# Patient Record
Sex: Male | Born: 1945
Health system: Southern US, Community
[De-identification: ages and names within clinical notes are randomized; demographics above are authoritative.]

## PROBLEM LIST (undated history)

## (undated) DIAGNOSIS — R7303 Prediabetes: Secondary | ICD-10-CM

## (undated) DIAGNOSIS — Z801 Family history of malignant neoplasm of trachea, bronchus and lung: Secondary | ICD-10-CM

## (undated) DIAGNOSIS — Z808 Family history of malignant neoplasm of other organs or systems: Secondary | ICD-10-CM

## (undated) DIAGNOSIS — Z8709 Personal history of other diseases of the respiratory system: Secondary | ICD-10-CM

## (undated) DIAGNOSIS — E785 Hyperlipidemia, unspecified: Secondary | ICD-10-CM

## (undated) DIAGNOSIS — E119 Type 2 diabetes mellitus without complications: Secondary | ICD-10-CM

## (undated) DIAGNOSIS — M2550 Pain in unspecified joint: Secondary | ICD-10-CM

## (undated) DIAGNOSIS — M1990 Unspecified osteoarthritis, unspecified site: Secondary | ICD-10-CM

## (undated) DIAGNOSIS — Z8042 Family history of malignant neoplasm of prostate: Secondary | ICD-10-CM

## (undated) DIAGNOSIS — M1611 Unilateral primary osteoarthritis, right hip: Secondary | ICD-10-CM

## (undated) DIAGNOSIS — I10 Essential (primary) hypertension: Secondary | ICD-10-CM

## (undated) DIAGNOSIS — Z803 Family history of malignant neoplasm of breast: Secondary | ICD-10-CM

## (undated) DIAGNOSIS — N529 Male erectile dysfunction, unspecified: Secondary | ICD-10-CM

## (undated) HISTORY — DX: Essential (primary) hypertension: I10

## (undated) HISTORY — DX: Family history of malignant neoplasm of trachea, bronchus and lung: Z80.1

## (undated) HISTORY — PX: CHOLECYSTECTOMY: SHX55

## (undated) HISTORY — PX: WRIST SURGERY: SHX841

## (undated) HISTORY — DX: Unspecified osteoarthritis, unspecified site: M19.90

## (undated) HISTORY — PX: FINGER SURGERY: SHX640

## (undated) HISTORY — PX: STEROID INJECTION TO SCAR: SHX2447

## (undated) HISTORY — PX: VEIN LIGATION AND STRIPPING: SHX2653

## (undated) HISTORY — DX: Male erectile dysfunction, unspecified: N52.9

## (undated) HISTORY — PX: SHOULDER SURGERY: SHX246

## (undated) HISTORY — PX: KNEE ARTHROSCOPY: SUR90

## (undated) HISTORY — DX: Family history of malignant neoplasm of prostate: Z80.42

## (undated) HISTORY — PX: TONSILLECTOMY: SUR1361

## (undated) HISTORY — DX: Hyperlipidemia, unspecified: E78.5

## (undated) HISTORY — DX: Family history of malignant neoplasm of other organs or systems: Z80.8

## (undated) HISTORY — PX: OTHER SURGICAL HISTORY: SHX169

## (undated) HISTORY — DX: Family history of malignant neoplasm of breast: Z80.3

## (undated) HISTORY — DX: Type 2 diabetes mellitus without complications: E11.9

## (undated) HISTORY — PX: COLONOSCOPY: SHX174

---

## 1970-07-01 HISTORY — PX: OTHER SURGICAL HISTORY: SHX169

## 2000-04-16 ENCOUNTER — Encounter: Admission: RE | Admit: 2000-04-16 | Discharge: 2000-07-15 | Payer: Self-pay | Admitting: Anesthesiology

## 2004-06-15 ENCOUNTER — Ambulatory Visit (HOSPITAL_COMMUNITY): Admission: RE | Admit: 2004-06-15 | Discharge: 2004-06-15 | Payer: Self-pay | Admitting: Gastroenterology

## 2004-09-26 ENCOUNTER — Encounter: Admission: RE | Admit: 2004-09-26 | Discharge: 2004-09-26 | Payer: Self-pay | Admitting: Family Medicine

## 2007-01-05 ENCOUNTER — Encounter: Admission: RE | Admit: 2007-01-05 | Discharge: 2007-01-05 | Payer: Self-pay | Admitting: Family Medicine

## 2007-02-27 ENCOUNTER — Ambulatory Visit (HOSPITAL_COMMUNITY): Admission: RE | Admit: 2007-02-27 | Discharge: 2007-02-27 | Payer: Self-pay | Admitting: Surgery

## 2010-11-13 NOTE — Op Note (Signed)
[name], [name]                 ACCOUNT NO.:  [id number]   MEDICAL RECORD NO.:  [id number]          PATIENT TYPE:  AMB   LOCATION:  DAY                          FACILITY:  [facility]   PHYSICIAN:  Ardeth Sportsman, MD     DATE OF BIRTH:  1945/09/11   DATE OF PROCEDURE:  02/27/2007  DATE OF DISCHARGE:                               OPERATIVE REPORT   PRIMARY CARE PHYSICIAN:  Donia Guiles, M.D.   SURGEON:  Ardeth Sportsman, MD.   ASSISTANT:  None.   PREOPERATIVE DIAGNOSIS:  Incarcerated ventral hernia from prior open  cholecystectomy incision. (Incarcerated with omentum)   POSTOPERATIVE DIAGNOSIS:  Incarcerated ventral hernia from prior open  cholecystectomy incision. (Incarcerated with omentum)   PROCEDURE:  Laparoscopic lysis of adhesions x60 minutes and a ventral  hernia repair using dual sided 20x30cm Proceed mesh (lightweight  polypropylene with nonadherent cellulose barrier).   ANESTHESIA:  1. General anesthesia.  2. Local anesthetic and field block around all fascial stitches and      port sites.   SPECIMENS:  None.   DRAINS:  None.   ASSISTANT:  Less than 5 mL   COMPLICATIONS:  None.   INDICATIONS FOR PROCEDURE:  [name] is a 65 year old male who had had  an open cholecystectomy done in 1985.  He had incisional hernia that was  repaired in 1987 but has developed recurrences.  Given his increased  physical activity, increasing discomfort at the incisional hernia site,  options discussed and recommendation was made for laparoscopic  exploration with repair of incisional hernia.   Risks such as stroke, heart attack, deep venous thrombosis, pulmonary  embolism and even death were discussed.  Risks such as bleeding, need  for transfusion, hematoma, seroma, wound infection, abscess, injury to  other organs, intracutaneous fistula, prolonged pain, recurrence of  hernia requiring reoperation and other risks were discussed.  Questions  answered and agreed to proceed.   OPERATIVE FINDINGS:  The medial half of his RUQ subcostal  cholecystectomy incision had Swiss cheese-like defects, several large  ones with a total region of 12 x 12 cm in size.  It was incarcerated  with omentum although transverse colon was nearby and probably was  sliding in and out as well.   DESCRIPTION OF PROCEDURE:  Informed consent was confirmed.  The patient  received IV antibiotics just prior to surgery.  He was positioned  supine, both arms tucked.  He had sequential compression devices active  during the entire case.  He underwent general anesthesia without any  difficulty.  He had a Foley catheter sterilely placed.  His abdomen was  prepped and draped in sterile fashion.   Entry was gained in the abdomen with the patient in steep reverse  Trendelenburg and left-side up and with a 5 mm port in left lower  quadrant using a 5 mm/0 degree scope and optical entry.  Capnoperitoneum  to 15 mmHg provided good abdominal insufflation.  Under direct  visualization a 10 mm port was placed in the left lower quadrant and a 5  mm port was placed in the  left lateral flank and a 5 mm port was placed  in the right lower quadrant.   Dense omental adhesions were noted on the anterior abdominal wall  especially around all of cholecystectomy incision.  These were freed off  sharply and hemostasis was  maintained using focused cautery.  There was  transverse colon nearby the area but able to be freed off and dissection  without cautery was used around those regions.  Ultimately, was able to  reduce the area out and measure out the defect as noted above.  Given  his large body habitus and obesity, I erred on the side of placing  larger piece of mesh, therefore a 20 x 30 cm mesh was chosen.  Alternating #1 Novofil and Ethibond stitches x10 were placed around the  rough edges of the mesh.  The mesh was rolled up, placed into the  peritoneal cavity, unrolled and tacked to the anterior abdominal  wall  using transfascial circumferential passes with Endoclose suture passer  device.  It provided over 2 inches of circumferential coverage around  the hernia defects to good result.  Because it was in right upper  quadrant and to avoid any tacking near the subcostal ridge, I ended up  passing a few extra transabdominal fascial stashes just through the mesh  after the mesh and tacked up to help secure the mesh on the superior,  especially right upper quadrant corners.  The mesh laid well.  Tacker  was used help tack the edges of the mesh well circumferentially.   Inspection was made of the omentum and other abdominal structures and  there was no evidence of any bleeding or any other injury.  Capnoperitoneum was completely evacuated.  Ports were removed.  The 10  mL port had been covered up by the mesh and I did not feel that it  required any more aggressive closure.  The other 5 mm ports were too  small to allow my pinky to pass.  Port sites were closed using 4-0  Monocryl stitch and puncture sites with fascial stitches were closed  using Dermabond.  A binder was placed.   The patient was extubated and taken to the recovery room in stable  condition.  I explained the operative findings to the patient's wife in  detail and also suggestion of postop instructions.  Questions answered.  She expressed understanding and appreciation.   The patient's wife had asked if I had removed any skin tags.  I was not  aware of this before surgery.  The patient did not mention it just  before surgery and it was not in my H&P and he did not add or change the  consent for that.  I apologized for not knowing about this, but will try  and address this issue and a later time since by the time she told me,  the patient was already in recovery.      Ardeth Sportsman, MD  Electronically Signed    SCG/MEDQ  D:  02/27/2007  T:  02/28/2007  Job:  [id number]   cc:   Donia Guiles, M.D.  Fax: [phone]

## 2010-11-16 NOTE — Procedures (Signed)
[facility]  Patient:    [name], [name]                        MRN: [id number] Proc. Date: 04/24/00 Adm. Date:  [id number] Attending:  Thyra Breed CC:         Jearld Adjutant, M.D.   Procedure Report  PROCEDURE:  Cervical epidural steroid injection.  DIAGNOSIS:  Cervical spondylosis with C6 radiculopathy, right upper extremity.  INTERVAL HISTORY:  The patient has noted marked attenuation in his discomfort. He is not taking any of the Vioxx now and really feels pretty good overall except for intermittent tingling out to the right thumb.  He feels very positive about where he is at.  He took his last dose of Vioxx on Sunday.  He is currently just taking a little bit of Advil as needed.  PHYSICAL EXAMINATION:  VITAL SIGNS:  The patient is afebrile with vital signs stable.  NEUROLOGIC:  Grossly unchanged.  DESCRIPTION OF PROCEDURE:  After informed consent was obtained, the patient was placed in the sitting position and monitored.  His back was prepped with Betadine x 3.  A skin wheal was raised at the C7-T1 interspace with 1% lidocaine.  A 20-gauge Tuohy needle was introduced to the cervical epidural space to loss of resistance to preservative-free normal saline.  There was no CSF nor blood.  Medrol 40 mg in 3 ml of preservative-free normal saline was gently injected.  The needle was flushed with preservative-free normal saline and removed intact.  POSTPROCEDURE CONDITION:  Stable.  DISCHARGE INSTRUCTIONS: 1. Resume previous diet. 2. Limitation of activities per instruction sheet. 3. Continue Advil p.r.n. 4. The patient plans to follow up with Dr. Renae Fickle. DD:  04/24/00 TD:  04/24/00 Job: [id number]
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
## 2011-04-12 LAB — HEMOGLOBIN AND HEMATOCRIT, BLOOD
HCT: 44.5
Hemoglobin: 15.5

## 2011-11-20 ENCOUNTER — Other Ambulatory Visit: Payer: Self-pay | Admitting: Orthopedic Surgery

## 2011-11-20 DIAGNOSIS — M25511 Pain in right shoulder: Secondary | ICD-10-CM

## 2011-11-21 ENCOUNTER — Ambulatory Visit
Admission: RE | Admit: 2011-11-21 | Discharge: 2011-11-21 | Disposition: A | Discharge status: Home / Self Care | Payer: 59 | Source: Ambulatory Visit | Attending: Orthopedic Surgery | Admitting: Orthopedic Surgery

## 2011-11-21 DIAGNOSIS — M25511 Pain in right shoulder: Secondary | ICD-10-CM

## 2011-12-06 DIAGNOSIS — Z87891 Personal history of nicotine dependence: Secondary | ICD-10-CM | POA: Diagnosis not present

## 2011-12-06 DIAGNOSIS — Z125 Encounter for screening for malignant neoplasm of prostate: Secondary | ICD-10-CM | POA: Diagnosis not present

## 2011-12-06 DIAGNOSIS — E782 Mixed hyperlipidemia: Secondary | ICD-10-CM | POA: Diagnosis not present

## 2011-12-06 DIAGNOSIS — Z0000 Encounter for general adult medical examination without abnormal findings: Secondary | ICD-10-CM | POA: Diagnosis not present

## 2011-12-06 DIAGNOSIS — I10 Essential (primary) hypertension: Secondary | ICD-10-CM | POA: Diagnosis not present

## 2011-12-06 DIAGNOSIS — Z01818 Encounter for other preprocedural examination: Secondary | ICD-10-CM | POA: Diagnosis not present

## 2011-12-06 DIAGNOSIS — E119 Type 2 diabetes mellitus without complications: Secondary | ICD-10-CM | POA: Diagnosis not present

## 2011-12-12 DIAGNOSIS — S43429A Sprain of unspecified rotator cuff capsule, initial encounter: Secondary | ICD-10-CM | POA: Diagnosis not present

## 2011-12-12 DIAGNOSIS — M67919 Unspecified disorder of synovium and tendon, unspecified shoulder: Secondary | ICD-10-CM | POA: Diagnosis not present

## 2011-12-12 DIAGNOSIS — G8918 Other acute postprocedural pain: Secondary | ICD-10-CM | POA: Diagnosis not present

## 2011-12-12 DIAGNOSIS — M719 Bursopathy, unspecified: Secondary | ICD-10-CM | POA: Diagnosis not present

## 2011-12-27 DIAGNOSIS — Z87891 Personal history of nicotine dependence: Secondary | ICD-10-CM | POA: Diagnosis not present

## 2012-01-29 DIAGNOSIS — M75120 Complete rotator cuff tear or rupture of unspecified shoulder, not specified as traumatic: Secondary | ICD-10-CM | POA: Diagnosis not present

## 2012-02-04 DIAGNOSIS — M75120 Complete rotator cuff tear or rupture of unspecified shoulder, not specified as traumatic: Secondary | ICD-10-CM | POA: Diagnosis not present

## 2012-02-07 DIAGNOSIS — M75120 Complete rotator cuff tear or rupture of unspecified shoulder, not specified as traumatic: Secondary | ICD-10-CM | POA: Diagnosis not present

## 2012-02-10 DIAGNOSIS — M75120 Complete rotator cuff tear or rupture of unspecified shoulder, not specified as traumatic: Secondary | ICD-10-CM | POA: Diagnosis not present

## 2012-02-14 DIAGNOSIS — M75120 Complete rotator cuff tear or rupture of unspecified shoulder, not specified as traumatic: Secondary | ICD-10-CM | POA: Diagnosis not present

## 2012-02-17 DIAGNOSIS — M75120 Complete rotator cuff tear or rupture of unspecified shoulder, not specified as traumatic: Secondary | ICD-10-CM | POA: Diagnosis not present

## 2012-02-21 DIAGNOSIS — M75120 Complete rotator cuff tear or rupture of unspecified shoulder, not specified as traumatic: Secondary | ICD-10-CM | POA: Diagnosis not present

## 2012-02-24 DIAGNOSIS — M75120 Complete rotator cuff tear or rupture of unspecified shoulder, not specified as traumatic: Secondary | ICD-10-CM | POA: Diagnosis not present

## 2012-02-25 DIAGNOSIS — J209 Acute bronchitis, unspecified: Secondary | ICD-10-CM | POA: Diagnosis not present

## 2012-02-25 DIAGNOSIS — H6690 Otitis media, unspecified, unspecified ear: Secondary | ICD-10-CM | POA: Diagnosis not present

## 2012-02-28 DIAGNOSIS — M75120 Complete rotator cuff tear or rupture of unspecified shoulder, not specified as traumatic: Secondary | ICD-10-CM | POA: Diagnosis not present

## 2012-03-04 DIAGNOSIS — M75120 Complete rotator cuff tear or rupture of unspecified shoulder, not specified as traumatic: Secondary | ICD-10-CM | POA: Diagnosis not present

## 2012-03-09 DIAGNOSIS — M75120 Complete rotator cuff tear or rupture of unspecified shoulder, not specified as traumatic: Secondary | ICD-10-CM | POA: Diagnosis not present

## 2012-03-13 DIAGNOSIS — M75120 Complete rotator cuff tear or rupture of unspecified shoulder, not specified as traumatic: Secondary | ICD-10-CM | POA: Diagnosis not present

## 2012-03-17 DIAGNOSIS — M75120 Complete rotator cuff tear or rupture of unspecified shoulder, not specified as traumatic: Secondary | ICD-10-CM | POA: Diagnosis not present

## 2012-03-20 DIAGNOSIS — M75120 Complete rotator cuff tear or rupture of unspecified shoulder, not specified as traumatic: Secondary | ICD-10-CM | POA: Diagnosis not present

## 2012-03-25 DIAGNOSIS — M75120 Complete rotator cuff tear or rupture of unspecified shoulder, not specified as traumatic: Secondary | ICD-10-CM | POA: Diagnosis not present

## 2012-04-08 DIAGNOSIS — M75120 Complete rotator cuff tear or rupture of unspecified shoulder, not specified as traumatic: Secondary | ICD-10-CM | POA: Diagnosis not present

## 2012-04-22 DIAGNOSIS — M75120 Complete rotator cuff tear or rupture of unspecified shoulder, not specified as traumatic: Secondary | ICD-10-CM | POA: Diagnosis not present

## 2012-04-29 DIAGNOSIS — M75120 Complete rotator cuff tear or rupture of unspecified shoulder, not specified as traumatic: Secondary | ICD-10-CM | POA: Diagnosis not present

## 2012-05-04 DIAGNOSIS — L821 Other seborrheic keratosis: Secondary | ICD-10-CM | POA: Diagnosis not present

## 2012-05-04 DIAGNOSIS — Z85828 Personal history of other malignant neoplasm of skin: Secondary | ICD-10-CM | POA: Diagnosis not present

## 2012-05-04 DIAGNOSIS — L909 Atrophic disorder of skin, unspecified: Secondary | ICD-10-CM | POA: Diagnosis not present

## 2012-05-04 DIAGNOSIS — I872 Venous insufficiency (chronic) (peripheral): Secondary | ICD-10-CM | POA: Diagnosis not present

## 2012-05-04 DIAGNOSIS — L919 Hypertrophic disorder of the skin, unspecified: Secondary | ICD-10-CM | POA: Diagnosis not present

## 2012-05-04 DIAGNOSIS — D239 Other benign neoplasm of skin, unspecified: Secondary | ICD-10-CM | POA: Diagnosis not present

## 2012-05-04 DIAGNOSIS — Z808 Family history of malignant neoplasm of other organs or systems: Secondary | ICD-10-CM | POA: Diagnosis not present

## 2012-05-06 DIAGNOSIS — M75120 Complete rotator cuff tear or rupture of unspecified shoulder, not specified as traumatic: Secondary | ICD-10-CM | POA: Diagnosis not present

## 2012-05-13 DIAGNOSIS — M75120 Complete rotator cuff tear or rupture of unspecified shoulder, not specified as traumatic: Secondary | ICD-10-CM | POA: Diagnosis not present

## 2012-05-20 DIAGNOSIS — M75120 Complete rotator cuff tear or rupture of unspecified shoulder, not specified as traumatic: Secondary | ICD-10-CM | POA: Diagnosis not present

## 2012-05-27 DIAGNOSIS — M75120 Complete rotator cuff tear or rupture of unspecified shoulder, not specified as traumatic: Secondary | ICD-10-CM | POA: Diagnosis not present

## 2012-06-12 DIAGNOSIS — I10 Essential (primary) hypertension: Secondary | ICD-10-CM | POA: Diagnosis not present

## 2012-06-12 DIAGNOSIS — E119 Type 2 diabetes mellitus without complications: Secondary | ICD-10-CM | POA: Diagnosis not present

## 2012-06-12 DIAGNOSIS — J069 Acute upper respiratory infection, unspecified: Secondary | ICD-10-CM | POA: Diagnosis not present

## 2012-06-12 DIAGNOSIS — E782 Mixed hyperlipidemia: Secondary | ICD-10-CM | POA: Diagnosis not present

## 2012-06-25 DIAGNOSIS — M75120 Complete rotator cuff tear or rupture of unspecified shoulder, not specified as traumatic: Secondary | ICD-10-CM | POA: Diagnosis not present

## 2012-06-29 DIAGNOSIS — M75120 Complete rotator cuff tear or rupture of unspecified shoulder, not specified as traumatic: Secondary | ICD-10-CM | POA: Diagnosis not present

## 2012-07-13 DIAGNOSIS — R7989 Other specified abnormal findings of blood chemistry: Secondary | ICD-10-CM | POA: Diagnosis not present

## 2012-10-07 DIAGNOSIS — M25559 Pain in unspecified hip: Secondary | ICD-10-CM | POA: Diagnosis not present

## 2012-10-07 DIAGNOSIS — M75120 Complete rotator cuff tear or rupture of unspecified shoulder, not specified as traumatic: Secondary | ICD-10-CM | POA: Diagnosis not present

## 2012-10-07 DIAGNOSIS — R109 Unspecified abdominal pain: Secondary | ICD-10-CM | POA: Diagnosis not present

## 2012-10-19 ENCOUNTER — Other Ambulatory Visit: Payer: Self-pay | Admitting: Family Medicine

## 2012-10-19 DIAGNOSIS — R1030 Lower abdominal pain, unspecified: Secondary | ICD-10-CM

## 2012-10-19 DIAGNOSIS — R1031 Right lower quadrant pain: Secondary | ICD-10-CM

## 2012-10-22 ENCOUNTER — Ambulatory Visit
Admission: RE | Admit: 2012-10-22 | Discharge: 2012-10-22 | Disposition: A | Discharge status: Home / Self Care | Payer: 59 | Source: Ambulatory Visit | Attending: Family Medicine | Admitting: Family Medicine

## 2012-10-22 DIAGNOSIS — R1030 Lower abdominal pain, unspecified: Secondary | ICD-10-CM

## 2012-10-22 DIAGNOSIS — R1031 Right lower quadrant pain: Secondary | ICD-10-CM

## 2012-10-22 DIAGNOSIS — K7689 Other specified diseases of liver: Secondary | ICD-10-CM | POA: Diagnosis not present

## 2012-10-22 MED ORDER — IOHEXOL 300 MG/ML  SOLN
150.0000 mL | Freq: Once | INTRAMUSCULAR | Status: AC | PRN
  Administered 2012-10-22: 150 mL via INTRAVENOUS

## 2012-11-03 DIAGNOSIS — M25559 Pain in unspecified hip: Secondary | ICD-10-CM | POA: Diagnosis not present

## 2012-11-05 DIAGNOSIS — M25559 Pain in unspecified hip: Secondary | ICD-10-CM | POA: Diagnosis not present

## 2012-11-11 DIAGNOSIS — M25559 Pain in unspecified hip: Secondary | ICD-10-CM | POA: Diagnosis not present

## 2012-11-13 DIAGNOSIS — M25559 Pain in unspecified hip: Secondary | ICD-10-CM | POA: Diagnosis not present

## 2012-11-27 DIAGNOSIS — M25559 Pain in unspecified hip: Secondary | ICD-10-CM | POA: Diagnosis not present

## 2012-12-01 DIAGNOSIS — M25559 Pain in unspecified hip: Secondary | ICD-10-CM | POA: Diagnosis not present

## 2012-12-03 DIAGNOSIS — M25559 Pain in unspecified hip: Secondary | ICD-10-CM | POA: Diagnosis not present

## 2012-12-07 DIAGNOSIS — M25559 Pain in unspecified hip: Secondary | ICD-10-CM | POA: Diagnosis not present

## 2012-12-17 DIAGNOSIS — M25559 Pain in unspecified hip: Secondary | ICD-10-CM | POA: Diagnosis not present

## 2012-12-24 DIAGNOSIS — M25559 Pain in unspecified hip: Secondary | ICD-10-CM | POA: Diagnosis not present

## 2012-12-30 DIAGNOSIS — M25559 Pain in unspecified hip: Secondary | ICD-10-CM | POA: Diagnosis not present

## 2013-01-06 DIAGNOSIS — M25559 Pain in unspecified hip: Secondary | ICD-10-CM | POA: Diagnosis not present

## 2013-01-11 DIAGNOSIS — S96819A Strain of other specified muscles and tendons at ankle and foot level, unspecified foot, initial encounter: Secondary | ICD-10-CM | POA: Diagnosis not present

## 2013-01-11 DIAGNOSIS — S93499A Sprain of other ligament of unspecified ankle, initial encounter: Secondary | ICD-10-CM | POA: Diagnosis not present

## 2013-01-14 DIAGNOSIS — M66369 Spontaneous rupture of flexor tendons, unspecified lower leg: Secondary | ICD-10-CM | POA: Diagnosis not present

## 2013-01-14 DIAGNOSIS — Y939 Activity, unspecified: Secondary | ICD-10-CM | POA: Diagnosis not present

## 2013-01-14 DIAGNOSIS — S93499A Sprain of other ligament of unspecified ankle, initial encounter: Secondary | ICD-10-CM | POA: Diagnosis not present

## 2013-01-14 DIAGNOSIS — M928 Other specified juvenile osteochondrosis: Secondary | ICD-10-CM | POA: Diagnosis not present

## 2013-01-14 DIAGNOSIS — Y999 Unspecified external cause status: Secondary | ICD-10-CM | POA: Diagnosis not present

## 2013-01-14 DIAGNOSIS — S96819A Strain of other specified muscles and tendons at ankle and foot level, unspecified foot, initial encounter: Secondary | ICD-10-CM | POA: Diagnosis not present

## 2013-01-14 DIAGNOSIS — Y929 Unspecified place or not applicable: Secondary | ICD-10-CM | POA: Diagnosis not present

## 2013-01-14 DIAGNOSIS — M7730 Calcaneal spur, unspecified foot: Secondary | ICD-10-CM | POA: Diagnosis not present

## 2013-01-14 DIAGNOSIS — X58XXXA Exposure to other specified factors, initial encounter: Secondary | ICD-10-CM | POA: Diagnosis not present

## 2013-03-18 DIAGNOSIS — M66369 Spontaneous rupture of flexor tendons, unspecified lower leg: Secondary | ICD-10-CM | POA: Diagnosis not present

## 2013-03-18 DIAGNOSIS — M7730 Calcaneal spur, unspecified foot: Secondary | ICD-10-CM | POA: Diagnosis not present

## 2013-03-24 DIAGNOSIS — M66369 Spontaneous rupture of flexor tendons, unspecified lower leg: Secondary | ICD-10-CM | POA: Diagnosis not present

## 2013-04-02 DIAGNOSIS — M25559 Pain in unspecified hip: Secondary | ICD-10-CM | POA: Diagnosis not present

## 2013-04-02 DIAGNOSIS — M7730 Calcaneal spur, unspecified foot: Secondary | ICD-10-CM | POA: Diagnosis not present

## 2013-04-05 DIAGNOSIS — M25559 Pain in unspecified hip: Secondary | ICD-10-CM | POA: Diagnosis not present

## 2013-04-05 DIAGNOSIS — M7730 Calcaneal spur, unspecified foot: Secondary | ICD-10-CM | POA: Diagnosis not present

## 2013-04-07 DIAGNOSIS — M7730 Calcaneal spur, unspecified foot: Secondary | ICD-10-CM | POA: Diagnosis not present

## 2013-04-07 DIAGNOSIS — M25559 Pain in unspecified hip: Secondary | ICD-10-CM | POA: Diagnosis not present

## 2013-04-13 DIAGNOSIS — M66369 Spontaneous rupture of flexor tendons, unspecified lower leg: Secondary | ICD-10-CM | POA: Diagnosis not present

## 2013-04-13 DIAGNOSIS — M25559 Pain in unspecified hip: Secondary | ICD-10-CM | POA: Diagnosis not present

## 2013-04-15 DIAGNOSIS — M25559 Pain in unspecified hip: Secondary | ICD-10-CM | POA: Diagnosis not present

## 2013-04-15 DIAGNOSIS — M66369 Spontaneous rupture of flexor tendons, unspecified lower leg: Secondary | ICD-10-CM | POA: Diagnosis not present

## 2013-04-20 DIAGNOSIS — M25559 Pain in unspecified hip: Secondary | ICD-10-CM | POA: Diagnosis not present

## 2013-04-22 DIAGNOSIS — M7730 Calcaneal spur, unspecified foot: Secondary | ICD-10-CM | POA: Diagnosis not present

## 2013-04-22 DIAGNOSIS — M25559 Pain in unspecified hip: Secondary | ICD-10-CM | POA: Diagnosis not present

## 2013-04-27 DIAGNOSIS — M25559 Pain in unspecified hip: Secondary | ICD-10-CM | POA: Diagnosis not present

## 2013-05-03 DIAGNOSIS — M25559 Pain in unspecified hip: Secondary | ICD-10-CM | POA: Diagnosis not present

## 2013-05-06 DIAGNOSIS — M66369 Spontaneous rupture of flexor tendons, unspecified lower leg: Secondary | ICD-10-CM | POA: Diagnosis not present

## 2013-05-06 DIAGNOSIS — M25559 Pain in unspecified hip: Secondary | ICD-10-CM | POA: Diagnosis not present

## 2013-05-10 DIAGNOSIS — M7730 Calcaneal spur, unspecified foot: Secondary | ICD-10-CM | POA: Diagnosis not present

## 2013-05-10 DIAGNOSIS — M25559 Pain in unspecified hip: Secondary | ICD-10-CM | POA: Diagnosis not present

## 2013-05-13 DIAGNOSIS — M7730 Calcaneal spur, unspecified foot: Secondary | ICD-10-CM | POA: Diagnosis not present

## 2013-05-13 DIAGNOSIS — M25559 Pain in unspecified hip: Secondary | ICD-10-CM | POA: Diagnosis not present

## 2013-05-19 DIAGNOSIS — IMO0002 Reserved for concepts with insufficient information to code with codable children: Secondary | ICD-10-CM | POA: Diagnosis not present

## 2013-05-20 DIAGNOSIS — M25559 Pain in unspecified hip: Secondary | ICD-10-CM | POA: Diagnosis not present

## 2013-05-20 DIAGNOSIS — M7730 Calcaneal spur, unspecified foot: Secondary | ICD-10-CM | POA: Diagnosis not present

## 2013-05-25 DIAGNOSIS — M25559 Pain in unspecified hip: Secondary | ICD-10-CM | POA: Diagnosis not present

## 2013-05-31 DIAGNOSIS — M25559 Pain in unspecified hip: Secondary | ICD-10-CM | POA: Diagnosis not present

## 2013-06-03 DIAGNOSIS — M66369 Spontaneous rupture of flexor tendons, unspecified lower leg: Secondary | ICD-10-CM | POA: Diagnosis not present

## 2013-06-03 DIAGNOSIS — M25559 Pain in unspecified hip: Secondary | ICD-10-CM | POA: Diagnosis not present

## 2013-06-08 DIAGNOSIS — M25559 Pain in unspecified hip: Secondary | ICD-10-CM | POA: Diagnosis not present

## 2013-06-11 DIAGNOSIS — M7730 Calcaneal spur, unspecified foot: Secondary | ICD-10-CM | POA: Diagnosis not present

## 2013-06-11 DIAGNOSIS — M25559 Pain in unspecified hip: Secondary | ICD-10-CM | POA: Diagnosis not present

## 2013-06-16 DIAGNOSIS — IMO0002 Reserved for concepts with insufficient information to code with codable children: Secondary | ICD-10-CM | POA: Diagnosis not present

## 2013-06-17 DIAGNOSIS — IMO0002 Reserved for concepts with insufficient information to code with codable children: Secondary | ICD-10-CM | POA: Diagnosis not present

## 2013-06-17 DIAGNOSIS — M66369 Spontaneous rupture of flexor tendons, unspecified lower leg: Secondary | ICD-10-CM | POA: Diagnosis not present

## 2013-06-17 DIAGNOSIS — M25559 Pain in unspecified hip: Secondary | ICD-10-CM | POA: Diagnosis not present

## 2013-06-18 ENCOUNTER — Other Ambulatory Visit: Payer: Self-pay | Admitting: Orthopedic Surgery

## 2013-06-18 DIAGNOSIS — M545 Low back pain: Secondary | ICD-10-CM

## 2013-06-22 DIAGNOSIS — M25559 Pain in unspecified hip: Secondary | ICD-10-CM | POA: Diagnosis not present

## 2013-06-25 ENCOUNTER — Ambulatory Visit
Admission: RE | Admit: 2013-06-25 | Discharge: 2013-06-25 | Disposition: A | Discharge status: Home / Self Care | Payer: Medicare Other | Source: Ambulatory Visit | Attending: Orthopedic Surgery | Admitting: Orthopedic Surgery

## 2013-06-25 DIAGNOSIS — M545 Low back pain: Secondary | ICD-10-CM

## 2013-06-25 DIAGNOSIS — M5126 Other intervertebral disc displacement, lumbar region: Secondary | ICD-10-CM | POA: Diagnosis not present

## 2013-06-25 DIAGNOSIS — M47817 Spondylosis without myelopathy or radiculopathy, lumbosacral region: Secondary | ICD-10-CM | POA: Diagnosis not present

## 2013-06-30 ENCOUNTER — Other Ambulatory Visit: Payer: 59

## 2013-06-30 DIAGNOSIS — M25559 Pain in unspecified hip: Secondary | ICD-10-CM | POA: Diagnosis not present

## 2013-07-02 DIAGNOSIS — IMO0002 Reserved for concepts with insufficient information to code with codable children: Secondary | ICD-10-CM | POA: Diagnosis not present

## 2013-07-06 DIAGNOSIS — M25559 Pain in unspecified hip: Secondary | ICD-10-CM | POA: Diagnosis not present

## 2013-07-13 DIAGNOSIS — IMO0002 Reserved for concepts with insufficient information to code with codable children: Secondary | ICD-10-CM | POA: Diagnosis not present

## 2013-07-21 DIAGNOSIS — M7730 Calcaneal spur, unspecified foot: Secondary | ICD-10-CM | POA: Diagnosis not present

## 2013-07-23 DIAGNOSIS — IMO0002 Reserved for concepts with insufficient information to code with codable children: Secondary | ICD-10-CM | POA: Diagnosis not present

## 2013-07-30 DIAGNOSIS — M545 Low back pain, unspecified: Secondary | ICD-10-CM | POA: Diagnosis not present

## 2013-08-02 DIAGNOSIS — IMO0002 Reserved for concepts with insufficient information to code with codable children: Secondary | ICD-10-CM | POA: Diagnosis not present

## 2013-08-06 DIAGNOSIS — IMO0002 Reserved for concepts with insufficient information to code with codable children: Secondary | ICD-10-CM | POA: Diagnosis not present

## 2013-08-09 DIAGNOSIS — M7730 Calcaneal spur, unspecified foot: Secondary | ICD-10-CM | POA: Diagnosis not present

## 2013-08-09 DIAGNOSIS — IMO0002 Reserved for concepts with insufficient information to code with codable children: Secondary | ICD-10-CM | POA: Diagnosis not present

## 2013-08-17 DIAGNOSIS — M545 Low back pain, unspecified: Secondary | ICD-10-CM | POA: Diagnosis not present

## 2013-08-20 DIAGNOSIS — M545 Low back pain, unspecified: Secondary | ICD-10-CM | POA: Diagnosis not present

## 2013-08-23 DIAGNOSIS — IMO0002 Reserved for concepts with insufficient information to code with codable children: Secondary | ICD-10-CM | POA: Diagnosis not present

## 2013-08-23 DIAGNOSIS — M545 Low back pain, unspecified: Secondary | ICD-10-CM | POA: Diagnosis not present

## 2013-08-24 DIAGNOSIS — IMO0002 Reserved for concepts with insufficient information to code with codable children: Secondary | ICD-10-CM | POA: Diagnosis not present

## 2013-08-30 DIAGNOSIS — M545 Low back pain, unspecified: Secondary | ICD-10-CM | POA: Diagnosis not present

## 2013-08-30 DIAGNOSIS — IMO0002 Reserved for concepts with insufficient information to code with codable children: Secondary | ICD-10-CM | POA: Diagnosis not present

## 2013-09-02 DIAGNOSIS — M545 Low back pain, unspecified: Secondary | ICD-10-CM | POA: Diagnosis not present

## 2013-09-06 DIAGNOSIS — M545 Low back pain, unspecified: Secondary | ICD-10-CM | POA: Diagnosis not present

## 2013-09-09 DIAGNOSIS — M545 Low back pain, unspecified: Secondary | ICD-10-CM | POA: Diagnosis not present

## 2013-09-13 DIAGNOSIS — M545 Low back pain, unspecified: Secondary | ICD-10-CM | POA: Diagnosis not present

## 2013-09-16 DIAGNOSIS — M545 Low back pain, unspecified: Secondary | ICD-10-CM | POA: Diagnosis not present

## 2013-09-20 DIAGNOSIS — M545 Low back pain, unspecified: Secondary | ICD-10-CM | POA: Diagnosis not present

## 2013-09-20 DIAGNOSIS — IMO0002 Reserved for concepts with insufficient information to code with codable children: Secondary | ICD-10-CM | POA: Diagnosis not present

## 2013-09-23 DIAGNOSIS — M545 Low back pain, unspecified: Secondary | ICD-10-CM | POA: Diagnosis not present

## 2013-09-30 DIAGNOSIS — M545 Low back pain, unspecified: Secondary | ICD-10-CM | POA: Diagnosis not present

## 2013-10-11 DIAGNOSIS — M545 Low back pain, unspecified: Secondary | ICD-10-CM | POA: Diagnosis not present

## 2013-11-10 DIAGNOSIS — L821 Other seborrheic keratosis: Secondary | ICD-10-CM | POA: Diagnosis not present

## 2013-11-10 DIAGNOSIS — Z85828 Personal history of other malignant neoplasm of skin: Secondary | ICD-10-CM | POA: Diagnosis not present

## 2013-11-10 DIAGNOSIS — D239 Other benign neoplasm of skin, unspecified: Secondary | ICD-10-CM | POA: Diagnosis not present

## 2013-11-10 DIAGNOSIS — Z808 Family history of malignant neoplasm of other organs or systems: Secondary | ICD-10-CM | POA: Diagnosis not present

## 2014-01-07 DIAGNOSIS — M25569 Pain in unspecified knee: Secondary | ICD-10-CM | POA: Diagnosis not present

## 2014-01-10 ENCOUNTER — Other Ambulatory Visit: Payer: Self-pay | Admitting: Family Medicine

## 2014-01-10 DIAGNOSIS — M25552 Pain in left hip: Secondary | ICD-10-CM

## 2014-01-10 DIAGNOSIS — R609 Edema, unspecified: Secondary | ICD-10-CM

## 2014-01-16 ENCOUNTER — Ambulatory Visit
Admission: RE | Admit: 2014-01-16 | Discharge: 2014-01-16 | Disposition: A | Discharge status: Home / Self Care | Payer: Medicare Other | Source: Ambulatory Visit | Attending: Family Medicine | Admitting: Family Medicine

## 2014-01-16 DIAGNOSIS — M169 Osteoarthritis of hip, unspecified: Secondary | ICD-10-CM | POA: Diagnosis not present

## 2014-01-16 DIAGNOSIS — M25552 Pain in left hip: Secondary | ICD-10-CM

## 2014-01-16 DIAGNOSIS — M1610 Unilateral primary osteoarthritis, unspecified hip: Secondary | ICD-10-CM | POA: Diagnosis not present

## 2014-01-16 DIAGNOSIS — R609 Edema, unspecified: Secondary | ICD-10-CM

## 2014-01-16 DIAGNOSIS — M25459 Effusion, unspecified hip: Secondary | ICD-10-CM | POA: Diagnosis not present

## 2014-01-24 DIAGNOSIS — M169 Osteoarthritis of hip, unspecified: Secondary | ICD-10-CM | POA: Diagnosis not present

## 2014-01-24 DIAGNOSIS — M1610 Unilateral primary osteoarthritis, unspecified hip: Secondary | ICD-10-CM | POA: Diagnosis not present

## 2014-01-24 DIAGNOSIS — M25559 Pain in unspecified hip: Secondary | ICD-10-CM | POA: Diagnosis not present

## 2014-01-25 ENCOUNTER — Other Ambulatory Visit: Payer: Self-pay | Admitting: Family Medicine

## 2014-01-25 DIAGNOSIS — M25552 Pain in left hip: Secondary | ICD-10-CM

## 2014-01-26 ENCOUNTER — Ambulatory Visit
Admission: RE | Admit: 2014-01-26 | Discharge: 2014-01-26 | Disposition: A | Discharge status: Home / Self Care | Payer: 59 | Source: Ambulatory Visit | Attending: Family Medicine | Admitting: Family Medicine

## 2014-01-26 DIAGNOSIS — M25552 Pain in left hip: Secondary | ICD-10-CM

## 2014-01-26 DIAGNOSIS — M25559 Pain in unspecified hip: Secondary | ICD-10-CM | POA: Diagnosis not present

## 2014-01-26 MED ORDER — METHYLPREDNISOLONE ACETATE 40 MG/ML INJ SUSP (RADIOLOG
120.0000 mg | Freq: Once | INTRAMUSCULAR | Status: AC
  Administered 2014-01-26: 120 mg via INTRA_ARTICULAR

## 2014-01-26 MED ORDER — IOHEXOL 180 MG/ML  SOLN
1.0000 mL | Freq: Once | INTRAMUSCULAR | Status: AC | PRN
  Administered 2014-01-26: 1 mL via INTRA_ARTICULAR

## 2014-09-13 DIAGNOSIS — I10 Essential (primary) hypertension: Secondary | ICD-10-CM | POA: Diagnosis not present

## 2014-09-13 DIAGNOSIS — E119 Type 2 diabetes mellitus without complications: Secondary | ICD-10-CM | POA: Diagnosis not present

## 2014-09-13 DIAGNOSIS — E782 Mixed hyperlipidemia: Secondary | ICD-10-CM | POA: Diagnosis not present

## 2014-09-21 DIAGNOSIS — M1612 Unilateral primary osteoarthritis, left hip: Secondary | ICD-10-CM | POA: Diagnosis not present

## 2014-09-24 DIAGNOSIS — M1612 Unilateral primary osteoarthritis, left hip: Secondary | ICD-10-CM | POA: Diagnosis not present

## 2014-10-13 ENCOUNTER — Other Ambulatory Visit: Payer: Self-pay | Admitting: Orthopaedic Surgery

## 2014-10-26 NOTE — Pre-Procedure Instructions (Signed)
HARDING THOMURE  10/26/2014   Your procedure is scheduled on:  Tues, May 10 @ 12:30 PM  Report to [facility] and report to Admitting at 10:30 AM.  Call this number if you have problems the morning of surgery: [phone]   Remember:   Do not eat food or drink liquids after midnight.                Stop taking your Aspirin,Diclofenac,Naproxen,Fish Oil,and Vit E. No Goody's,BC's,Ibuprofen,or any Herbal Medications.    Do not wear jewelry, make-up or nail polish.  Do not wear lotions, powders, or perfumes. You may wear deodorant.  Do not shave 48 hours prior to surgery. Men may shave face and neck.  Do not bring valuables to the hospital.  [facility] is not responsible                  for any belongings or valuables.               Contacts, dentures or bridgework may not be worn into surgery.  Leave suitcase in the car. After surgery it may be brought to your room.  For patients admitted to the hospital, discharge time is determined by your                treatment team.                   Special Instructions:  [place] - Preparing for Surgery  Before surgery, you can play an important role.  Because skin is not sterile, your skin needs to be as free of germs as possible.  You can reduce the number of germs on you skin by washing with CHG (chlorahexidine gluconate) soap before surgery.  CHG is an antiseptic cleaner which kills germs and bonds with the skin to continue killing germs even after washing.  Please DO NOT use if you have an allergy to CHG or antibacterial soaps.  If your skin becomes reddened/irritated stop using the CHG and inform your nurse when you arrive at Short Stay.  Do not shave (including legs and underarms) for at least 48 hours prior to the first CHG shower.  You may shave your face.  Please follow these instructions carefully:   1.  Shower with CHG Soap the night before surgery and the                                morning of Surgery.  2.  If you  choose to wash your hair, wash your hair first as usual with your       normal shampoo.  3.  After you shampoo, rinse your hair and body thoroughly to remove the                      Shampoo.  4.  Use CHG as you would any other liquid soap.  You can apply chg directly       to the skin and wash gently with scrungie or a clean washcloth.  5.  Apply the CHG Soap to your body ONLY FROM THE NECK DOWN.        Do not use on open wounds or open sores.  Avoid contact with your eyes,       ears, mouth and genitals (private parts).  Wash genitals (private parts)       with your  normal soap.  6.  Wash thoroughly, paying special attention to the area where your surgery        will be performed.  7.  Thoroughly rinse your body with warm water from the neck down.  8.  DO NOT shower/wash with your normal soap after using and rinsing off       the CHG Soap.  9.  Pat yourself dry with a clean towel.            10.  Wear clean pajamas.            11.  Place clean sheets on your bed the night of your first shower and do not        sleep with pets.  Day of Surgery  Do not apply any lotions/deoderants the morning of surgery.  Please wear clean clothes to the hospital/surgery center.     Please read over the following fact sheets that you were given: Pain Booklet, Coughing and Deep Breathing, Blood Transfusion Information, MRSA Information and Surgical Site Infection Prevention

## 2014-10-27 ENCOUNTER — Encounter (HOSPITAL_COMMUNITY): Payer: Self-pay

## 2014-10-27 ENCOUNTER — Encounter (HOSPITAL_COMMUNITY)
Admission: RE | Admit: 2014-10-27 | Discharge: 2014-10-27 | Disposition: A | Discharge status: Home / Self Care | Payer: 59 | Source: Ambulatory Visit | Attending: Orthopaedic Surgery | Admitting: Orthopaedic Surgery

## 2014-10-27 DIAGNOSIS — R918 Other nonspecific abnormal finding of lung field: Secondary | ICD-10-CM | POA: Diagnosis not present

## 2014-10-27 DIAGNOSIS — Z01818 Encounter for other preprocedural examination: Secondary | ICD-10-CM | POA: Diagnosis not present

## 2014-10-27 DIAGNOSIS — I10 Essential (primary) hypertension: Secondary | ICD-10-CM | POA: Insufficient documentation

## 2014-10-27 DIAGNOSIS — Z01812 Encounter for preprocedural laboratory examination: Secondary | ICD-10-CM | POA: Diagnosis not present

## 2014-10-27 DIAGNOSIS — Z01810 Encounter for preprocedural cardiovascular examination: Secondary | ICD-10-CM | POA: Diagnosis not present

## 2014-10-27 DIAGNOSIS — E785 Hyperlipidemia, unspecified: Secondary | ICD-10-CM | POA: Diagnosis not present

## 2014-10-27 DIAGNOSIS — I517 Cardiomegaly: Secondary | ICD-10-CM | POA: Diagnosis not present

## 2014-10-27 HISTORY — DX: Pain in unspecified joint: M25.50

## 2014-10-27 HISTORY — DX: Personal history of other diseases of the respiratory system: Z87.09

## 2014-10-27 LAB — BASIC METABOLIC PANEL
ANION GAP: 7 (ref 5–15)
BUN: 16 mg/dL (ref 6–23)
CO2: 28 mmol/L (ref 19–32)
CREATININE: 0.86 mg/dL (ref 0.50–1.35)
Calcium: 9.3 mg/dL (ref 8.4–10.5)
Chloride: 104 mmol/L (ref 96–112)
GFR calc non Af Amer: 88 mL/min — ABNORMAL LOW (ref >90)
Glucose, Bld: 159 mg/dL — ABNORMAL HIGH (ref 70–99)
Potassium: 4.1 mmol/L (ref 3.5–5.1)
SODIUM: 139 mmol/L (ref 135–145)

## 2014-10-27 LAB — SURGICAL PCR SCREEN
MRSA, PCR: NEGATIVE
Staphylococcus aureus: POSITIVE — AB

## 2014-10-27 LAB — PROTIME-INR
INR: 1.04 (ref 0.00–1.49)
Prothrombin Time: 13.8 seconds (ref 11.6–15.2)

## 2014-10-27 LAB — URINALYSIS, ROUTINE W REFLEX MICROSCOPIC
Bilirubin Urine: NEGATIVE
Glucose, UA: NEGATIVE mg/dL
Hgb urine dipstick: NEGATIVE
Ketones, ur: NEGATIVE mg/dL
NITRITE: NEGATIVE
Protein, ur: NEGATIVE mg/dL
SPECIFIC GRAVITY, URINE: 1.019 (ref 1.005–1.030)
UROBILINOGEN UA: 1 mg/dL (ref 0.0–1.0)
pH: 5.5 (ref 5.0–8.0)

## 2014-10-27 LAB — TYPE AND SCREEN
ABO/RH(D): O POS
Antibody Screen: NEGATIVE

## 2014-10-27 LAB — CBC WITH DIFFERENTIAL/PLATELET
Basophils Absolute: 0 10*3/uL (ref 0.0–0.1)
Basophils Relative: 0 % (ref 0–1)
Eosinophils Absolute: 0.2 10*3/uL (ref 0.0–0.7)
Eosinophils Relative: 3 % (ref 0–5)
HEMATOCRIT: 46.4 % (ref 39.0–52.0)
HEMOGLOBIN: 15.4 g/dL (ref 13.0–17.0)
Lymphocytes Relative: 20 % (ref 12–46)
Lymphs Abs: 1 10*3/uL (ref 0.7–4.0)
MCH: 30.4 pg (ref 26.0–34.0)
MCHC: 33.2 g/dL (ref 30.0–36.0)
MCV: 91.5 fL (ref 78.0–100.0)
MONO ABS: 0.5 10*3/uL (ref 0.1–1.0)
Monocytes Relative: 9 % (ref 3–12)
Neutro Abs: 3.5 10*3/uL (ref 1.7–7.7)
Neutrophils Relative %: 68 % (ref 43–77)
Platelets: 167 10*3/uL (ref 150–400)
RBC: 5.07 MIL/uL (ref 4.22–5.81)
RDW: 13.1 % (ref 11.5–15.5)
WBC: 5.2 10*3/uL (ref 4.0–10.5)

## 2014-10-27 LAB — URINE MICROSCOPIC-ADD ON

## 2014-10-27 LAB — ABO/RH: ABO/RH(D): O POS

## 2014-10-27 LAB — APTT: aPTT: 30 seconds (ref 24–37)

## 2014-10-27 MED ORDER — CHLORHEXIDINE GLUCONATE 4 % EX LIQD: 60.0000 mL | Freq: Once | CUTANEOUS | Status: DC

## 2014-10-27 NOTE — Progress Notes (Signed)
Anesthesia Chart Review:  Pt is 69 year old male scheduled for L total hip arthroplasty on 11/08/2014 with Dr. Rhona Raider.   PMH includes: HTN, hyperlipidemia. Former smoker. BMI 42.   Preoperative labs reviewed.  Few bacteria and 7-10 WBC/hpf on urine microscopy.   Chest x-ray reviewed. L basilar nodular density. CT chest recommended.   EKG: NSR. Cannot rule out Anterior infarct, age undetermined  Notified Juliann Pulse in Dr. Jerald Kief office of urine and CXR results.   Discussed EKG with Dr. Lissa Hoard.   If no changes, I anticipate pt can proceed with surgery as scheduled.   Willeen Cass, FNP-BC [facility] Short Stay Surgical Center/Anesthesiology Phone: [phone] 10/27/2014 4:11 PM

## 2014-10-27 NOTE — Progress Notes (Signed)
Mupirocin script called into the [facility] on [address] and [address]

## 2014-10-27 NOTE — Progress Notes (Addendum)
Pt saw a Cardiologist in the 80's-saw just bc of family history   Medical Md is Dr.Kimberlee Brigitte Pulse  Denies ever having an Echo  Stress test in the 80's  Denies ever having a Heart cath  Denies having an EKG/CXR

## 2014-10-27 NOTE — Progress Notes (Signed)
   10/27/14 0946  OBSTRUCTIVE SLEEP APNEA  Have you ever been diagnosed with sleep apnea through a sleep study? No  Do you snore loudly (loud enough to be heard through closed doors)?  1  Do you often feel tired, fatigued, or sleepy during the daytime? 0  Has anyone observed you stop breathing during your sleep? 0  Do you have, or are you being treated for high blood pressure? 1  BMI more than 35 kg/m2? 1  Age over 68 years old? 1  Neck circumference greater than 40 cm/16 inches? 1 (19)  Gender: 1

## 2014-11-04 NOTE — H&P (Signed)
TOTAL HIP ADMISSION H&P  Patient is admitted for left total hip arthroplasty.  Subjective:  Chief Complaint: left hip pain  HPI: [name], 69 y.o. male, has a history of pain and functional disability in the left hip(s) due to arthritis and patient has failed non-surgical conservative treatments for greater than 12 weeks to include NSAID's and/or analgesics, corticosteriod injections, flexibility and strengthening excercises, weight reduction as appropriate and activity modification.  Onset of symptoms was gradual starting 5 years ago with gradually worsening course since that time.The patient noted no past surgery on the left hip(s).  Patient currently rates pain in the left hip at 10 out of 10 with activity. Patient has night pain, worsening of pain with activity and weight bearing, trendelenberg gait, pain that interfers with activities of daily living and crepitus. Patient has evidence of subchondral cysts, subchondral sclerosis, periarticular osteophytes and joint space narrowing by imaging studies. This condition presents safety issues increasing the risk of falls. There is no current active infection.  There are no active problems to display for this patient.  Past Medical History  Diagnosis Date  . Hyperlipidemia     takes Simvastatin daily  . Osteoarthritis     knee  . ED (erectile dysfunction)     takes Viagra as needed  . Hypertension     takes Lisinopril daily  . Joint pain   . History of bronchitis last time about 38yrs ago    Past Surgical History  Procedure Laterality Date  . Achiles tendon Left   . Shoulder surgery Right   . Knee arthroscopy Left   . Finger surgery      left pointer   . Cholecystectomy      with mesh  . Vein ligation and stripping Left at age 54  . Wrist surgery Left at age 24  . Wisdom teeth extracted  1972  . Colonoscopy    . Steroid injection to scar      about 6 months ago    No prescriptions prior to admission   No Known Allergies   History  Substance Use Topics  . Smoking status: Former Research scientist (life sciences)  . Smokeless tobacco: Not on file     Comment: quit smoking in 1993  . Alcohol Use: Yes     Comment: rarely    No family history on file.   Review of Systems  Musculoskeletal: Positive for joint pain.       Left hip  All other systems reviewed and are negative.   Objective:  Physical Exam  Constitutional: He is oriented to person, place, and time. He appears well-developed and well-nourished.  HENT:  Head: Normocephalic and atraumatic.  Eyes: Conjunctivae are normal. Pupils are equal, round, and reactive to light.  Neck: Normal range of motion.  Cardiovascular: Normal rate and regular rhythm.   Respiratory: Effort normal.  GI: Soft.  Musculoskeletal:  Left hip motion is a bit limited in rotation and somewhat painful.  Leg lengths look roughly equal.  He walks with a markedly altered gait.  He is significantly overweight but most of this is central.  His skin is benign across the hip.  Sensation and motor function are intact in his feet with palpable pulses on both sides.  There is no lymphadenopathy at the groin.  Neurological: He is alert and oriented to person, place, and time.  Skin: Skin is warm.  Psychiatric: He has a normal mood and affect. His behavior is normal. Judgment and thought content normal.  Vital signs in last 24 hours:    Labs:   There is no height or weight on file to calculate BMI.   Imaging Review Plain radiographs demonstrate severe degenerative joint disease of the left hip(s). The bone quality appears to be good for age and reported activity level.  Assessment/Plan:  End stage primary arthritis, left hip(s)  The patient history, physical examination, clinical judgement of the provider and imaging studies are consistent with end stage degenerative joint disease of the left hip(s) and total hip arthroplasty is deemed medically necessary. The treatment options including medical  management, injection therapy, arthroscopy and arthroplasty were discussed at length. The risks and benefits of total hip arthroplasty were presented and reviewed. The risks due to aseptic loosening, infection, stiffness, dislocation/subluxation,  thromboembolic complications and other imponderables were discussed.  The patient acknowledged the explanation, agreed to proceed with the plan and consent was signed. Patient is being admitted for inpatient treatment for surgery, pain control, PT, OT, prophylactic antibiotics, VTE prophylaxis, progressive ambulation and ADL's and discharge planning.The patient is planning to be discharged home with home health services

## 2014-11-07 MED ORDER — CEFAZOLIN SODIUM 10 G IJ SOLR
3.0000 g | INTRAMUSCULAR | Status: AC
  Administered 2014-11-08: 3 g via INTRAVENOUS
  Filled 2014-11-07: qty 3000

## 2014-11-07 NOTE — Progress Notes (Signed)
Message left for patient to come in at 915 for surgery tomorrow.ssc number [phone] left for any questions

## 2014-11-08 ENCOUNTER — Inpatient Hospital Stay (HOSPITAL_COMMUNITY)
Admission: RE | Admit: 2014-11-08 | Discharge: 2014-11-11 | DRG: 470 | Disposition: A | Discharge status: Home Health | Payer: 59 | Source: Ambulatory Visit | Attending: Orthopaedic Surgery | Admitting: Orthopaedic Surgery

## 2014-11-08 ENCOUNTER — Encounter (HOSPITAL_COMMUNITY)
Admission: RE | Disposition: A | Discharge status: Home Health | Payer: Self-pay | Source: Ambulatory Visit | Attending: Orthopaedic Surgery

## 2014-11-08 ENCOUNTER — Inpatient Hospital Stay (HOSPITAL_COMMUNITY): Payer: 59 | Admitting: Emergency Medicine

## 2014-11-08 ENCOUNTER — Inpatient Hospital Stay (HOSPITAL_COMMUNITY): Payer: 59

## 2014-11-08 ENCOUNTER — Inpatient Hospital Stay (HOSPITAL_COMMUNITY): Payer: 59 | Admitting: Certified Registered Nurse Anesthetist

## 2014-11-08 ENCOUNTER — Encounter (HOSPITAL_COMMUNITY): Payer: Self-pay | Admitting: Certified Registered Nurse Anesthetist

## 2014-11-08 DIAGNOSIS — Z7982 Long term (current) use of aspirin: Secondary | ICD-10-CM

## 2014-11-08 DIAGNOSIS — Z87891 Personal history of nicotine dependence: Secondary | ICD-10-CM

## 2014-11-08 DIAGNOSIS — R9389 Abnormal findings on diagnostic imaging of other specified body structures: Secondary | ICD-10-CM

## 2014-11-08 DIAGNOSIS — Z79899 Other long term (current) drug therapy: Secondary | ICD-10-CM

## 2014-11-08 DIAGNOSIS — I10 Essential (primary) hypertension: Secondary | ICD-10-CM | POA: Diagnosis present

## 2014-11-08 DIAGNOSIS — Z96642 Presence of left artificial hip joint: Secondary | ICD-10-CM | POA: Diagnosis not present

## 2014-11-08 DIAGNOSIS — Z419 Encounter for procedure for purposes other than remedying health state, unspecified: Secondary | ICD-10-CM

## 2014-11-08 DIAGNOSIS — E785 Hyperlipidemia, unspecified: Secondary | ICD-10-CM | POA: Diagnosis present

## 2014-11-08 DIAGNOSIS — M1612 Unilateral primary osteoarthritis, left hip: Secondary | ICD-10-CM | POA: Diagnosis not present

## 2014-11-08 DIAGNOSIS — N529 Male erectile dysfunction, unspecified: Secondary | ICD-10-CM | POA: Diagnosis present

## 2014-11-08 DIAGNOSIS — M25552 Pain in left hip: Secondary | ICD-10-CM | POA: Diagnosis not present

## 2014-11-08 DIAGNOSIS — M169 Osteoarthritis of hip, unspecified: Secondary | ICD-10-CM | POA: Diagnosis not present

## 2014-11-08 DIAGNOSIS — Z471 Aftercare following joint replacement surgery: Secondary | ICD-10-CM | POA: Diagnosis not present

## 2014-11-08 HISTORY — PX: TOTAL HIP ARTHROPLASTY: SHX124

## 2014-11-08 SURGERY — ARTHROPLASTY, HIP, TOTAL, ANTERIOR APPROACH
Anesthesia: Spinal | Site: Hip | Laterality: Left

## 2014-11-08 MED ORDER — METOCLOPRAMIDE HCL 5 MG/ML IJ SOLN: 5.0000 mg | Freq: Three times a day (TID) | INTRAMUSCULAR | Status: DC | PRN

## 2014-11-08 MED ORDER — PROPOFOL INFUSION 10 MG/ML OPTIME
INTRAVENOUS | Status: DC | PRN
  Administered 2014-11-08: 100 ug/kg/min via INTRAVENOUS

## 2014-11-08 MED ORDER — PHENYLEPHRINE 40 MCG/ML (10ML) SYRINGE FOR IV PUSH (FOR BLOOD PRESSURE SUPPORT)
PREFILLED_SYRINGE | INTRAVENOUS | Status: AC
  Filled 2014-11-08: qty 10

## 2014-11-08 MED ORDER — MIDAZOLAM HCL 2 MG/2ML IJ SOLN
INTRAMUSCULAR | Status: AC
  Filled 2014-11-08: qty 2

## 2014-11-08 MED ORDER — PROPOFOL 10 MG/ML IV BOLUS
INTRAVENOUS | Status: AC
  Filled 2014-11-08: qty 20

## 2014-11-08 MED ORDER — LIDOCAINE HCL (CARDIAC) 20 MG/ML IV SOLN
INTRAVENOUS | Status: AC
  Filled 2014-11-08: qty 5

## 2014-11-08 MED ORDER — PHENOL 1.4 % MT LIQD: 1.0000 | OROMUCOSAL | Status: DC | PRN

## 2014-11-08 MED ORDER — LACTATED RINGERS IV SOLN: INTRAVENOUS | Status: DC

## 2014-11-08 MED ORDER — LACTATED RINGERS IV SOLN
INTRAVENOUS | Status: DC
  Administered 2014-11-08 (×2): via INTRAVENOUS

## 2014-11-08 MED ORDER — DIPHENHYDRAMINE HCL 12.5 MG/5ML PO ELIX: 12.5000 mg | ORAL_SOLUTION | ORAL | Status: DC | PRN

## 2014-11-08 MED ORDER — ACETAMINOPHEN 10 MG/ML IV SOLN
1000.0000 mg | Freq: Once | INTRAVENOUS | Status: AC
  Administered 2014-11-08: 1000 mg via INTRAVENOUS

## 2014-11-08 MED ORDER — HYDROCODONE-ACETAMINOPHEN 5-325 MG PO TABS
1.0000 | ORAL_TABLET | ORAL | Status: DC | PRN
  Administered 2014-11-08 – 2014-11-11 (×12): 2 via ORAL
  Filled 2014-11-08 (×11): qty 2

## 2014-11-08 MED ORDER — GLYCOPYRROLATE 0.2 MG/ML IJ SOLN
INTRAMUSCULAR | Status: DC | PRN
  Administered 2014-11-08: 0.1 mg via INTRAVENOUS
  Administered 2014-11-08: 0.2 mg via INTRAVENOUS

## 2014-11-08 MED ORDER — ROCURONIUM BROMIDE 50 MG/5ML IV SOLN
INTRAVENOUS | Status: AC
  Filled 2014-11-08: qty 1

## 2014-11-08 MED ORDER — CALCIUM CHLORIDE 10 % IV SOLN
INTRAVENOUS | Status: AC
  Filled 2014-11-08: qty 10

## 2014-11-08 MED ORDER — CALCIUM CHLORIDE 10 % IV SOLN
INTRAVENOUS | Status: DC | PRN
  Administered 2014-11-08 (×2): 100 mg via INTRAVENOUS

## 2014-11-08 MED ORDER — VITAMIN B-12 100 MCG PO TABS
100.0000 ug | ORAL_TABLET | Freq: Every day | ORAL | Status: DC
  Administered 2014-11-08 – 2014-11-11 (×3): 100 ug via ORAL
  Filled 2014-11-08 (×4): qty 1

## 2014-11-08 MED ORDER — BUPIVACAINE HCL (PF) 0.5 % IJ SOLN
INTRAMUSCULAR | Status: DC | PRN
  Administered 2014-11-08: 3 mL

## 2014-11-08 MED ORDER — FENTANYL CITRATE (PF) 250 MCG/5ML IJ SOLN
INTRAMUSCULAR | Status: AC
  Filled 2014-11-08: qty 5

## 2014-11-08 MED ORDER — HYDROMORPHONE HCL 1 MG/ML IJ SOLN
INTRAMUSCULAR | Status: AC
  Filled 2014-11-08: qty 1

## 2014-11-08 MED ORDER — TRANEXAMIC ACID 1000 MG/10ML IV SOLN
1000.0000 mg | INTRAVENOUS | Status: AC
  Administered 2014-11-08: 1000 mg via INTRAVENOUS
  Filled 2014-11-08: qty 10

## 2014-11-08 MED ORDER — HYDROMORPHONE HCL 1 MG/ML IJ SOLN
0.2500 mg | INTRAMUSCULAR | Status: DC | PRN
  Administered 2014-11-08: 0.25 mg via INTRAVENOUS
  Administered 2014-11-08: 0.5 mg via INTRAVENOUS
  Administered 2014-11-08: 0.25 mg via INTRAVENOUS
  Administered 2014-11-08: 0.5 mg via INTRAVENOUS

## 2014-11-08 MED ORDER — METOCLOPRAMIDE HCL 5 MG PO TABS: 5.0000 mg | ORAL_TABLET | Freq: Three times a day (TID) | ORAL | Status: DC | PRN

## 2014-11-08 MED ORDER — LISINOPRIL 10 MG PO TABS
10.0000 mg | ORAL_TABLET | Freq: Every day | ORAL | Status: DC
  Administered 2014-11-08 – 2014-11-11 (×2): 10 mg via ORAL
  Filled 2014-11-08 (×3): qty 1

## 2014-11-08 MED ORDER — DOCUSATE SODIUM 100 MG PO CAPS
100.0000 mg | ORAL_CAPSULE | Freq: Two times a day (BID) | ORAL | Status: DC
  Administered 2014-11-08 – 2014-11-11 (×6): 100 mg via ORAL
  Filled 2014-11-08 (×6): qty 1

## 2014-11-08 MED ORDER — ARTIFICIAL TEARS OP OINT
TOPICAL_OINTMENT | OPHTHALMIC | Status: AC
  Filled 2014-11-08: qty 3.5

## 2014-11-08 MED ORDER — HYDROMORPHONE HCL 1 MG/ML IJ SOLN
0.5000 mg | INTRAMUSCULAR | Status: AC | PRN
  Administered 2014-11-08 (×4): 0.5 mg via INTRAVENOUS

## 2014-11-08 MED ORDER — ACETAMINOPHEN 10 MG/ML IV SOLN
INTRAVENOUS | Status: AC
Start: 2014-11-08 — End: 2014-11-09
  Filled 2014-11-08: qty 100

## 2014-11-08 MED ORDER — ACETAMINOPHEN 650 MG RE SUPP: 650.0000 mg | Freq: Four times a day (QID) | RECTAL | Status: DC | PRN

## 2014-11-08 MED ORDER — DEXTROSE 5 % IV SOLN
10.0000 mg | INTRAVENOUS | Status: DC | PRN
  Administered 2014-11-08: 50 ug/min via INTRAVENOUS

## 2014-11-08 MED ORDER — METHOCARBAMOL 500 MG PO TABS
500.0000 mg | ORAL_TABLET | Freq: Four times a day (QID) | ORAL | Status: DC | PRN
  Administered 2014-11-08 – 2014-11-09 (×2): 500 mg via ORAL
  Filled 2014-11-08 (×2): qty 1

## 2014-11-08 MED ORDER — SIMVASTATIN 20 MG PO TABS
20.0000 mg | ORAL_TABLET | Freq: Every day | ORAL | Status: DC
  Administered 2014-11-08 – 2014-11-10 (×3): 20 mg via ORAL
  Filled 2014-11-08 (×3): qty 1

## 2014-11-08 MED ORDER — PROMETHAZINE HCL 25 MG/ML IJ SOLN: 6.2500 mg | INTRAMUSCULAR | Status: DC | PRN

## 2014-11-08 MED ORDER — BISACODYL 5 MG PO TBEC: 5.0000 mg | DELAYED_RELEASE_TABLET | Freq: Every day | ORAL | Status: DC | PRN

## 2014-11-08 MED ORDER — SUCCINYLCHOLINE CHLORIDE 20 MG/ML IJ SOLN
INTRAMUSCULAR | Status: AC
  Filled 2014-11-08: qty 1

## 2014-11-08 MED ORDER — MIDAZOLAM HCL 5 MG/5ML IJ SOLN
INTRAMUSCULAR | Status: DC | PRN
  Administered 2014-11-08: 2 mg via INTRAVENOUS

## 2014-11-08 MED ORDER — DEXTROSE 5 % IV SOLN
3.0000 g | Freq: Four times a day (QID) | INTRAVENOUS | Status: AC
  Administered 2014-11-08 – 2014-11-09 (×2): 3 g via INTRAVENOUS
  Filled 2014-11-08 (×2): qty 3000

## 2014-11-08 MED ORDER — METHOCARBAMOL 1000 MG/10ML IJ SOLN
500.0000 mg | Freq: Four times a day (QID) | INTRAVENOUS | Status: DC | PRN
  Administered 2014-11-08: 500 mg via INTRAVENOUS
  Filled 2014-11-08: qty 5

## 2014-11-08 MED ORDER — ASPIRIN EC 325 MG PO TBEC
325.0000 mg | DELAYED_RELEASE_TABLET | Freq: Two times a day (BID) | ORAL | Status: DC
  Administered 2014-11-09 – 2014-11-11 (×5): 325 mg via ORAL
  Filled 2014-11-08 (×5): qty 1

## 2014-11-08 MED ORDER — MENTHOL 3 MG MT LOZG: 1.0000 | LOZENGE | OROMUCOSAL | Status: DC | PRN

## 2014-11-08 MED ORDER — ALBUMIN HUMAN 5 % IV SOLN
INTRAVENOUS | Status: DC | PRN
  Administered 2014-11-08 (×2): via INTRAVENOUS

## 2014-11-08 MED ORDER — ACETAMINOPHEN 325 MG PO TABS: 650.0000 mg | ORAL_TABLET | Freq: Four times a day (QID) | ORAL | Status: DC | PRN

## 2014-11-08 MED ORDER — ONDANSETRON HCL 4 MG PO TABS: 4.0000 mg | ORAL_TABLET | Freq: Four times a day (QID) | ORAL | Status: DC | PRN

## 2014-11-08 MED ORDER — METHOCARBAMOL 1000 MG/10ML IJ SOLN
500.0000 mg | INTRAVENOUS | Status: AC
  Administered 2014-11-08: 500 mg via INTRAVENOUS
  Filled 2014-11-08: qty 5

## 2014-11-08 MED ORDER — HYDROMORPHONE HCL 1 MG/ML IJ SOLN
0.5000 mg | INTRAMUSCULAR | Status: DC | PRN
  Administered 2014-11-09: 1 mg via INTRAVENOUS
  Filled 2014-11-08: qty 1

## 2014-11-08 MED ORDER — ALUM & MAG HYDROXIDE-SIMETH 200-200-20 MG/5ML PO SUSP: 30.0000 mL | ORAL | Status: DC | PRN

## 2014-11-08 MED ORDER — PHENYLEPHRINE HCL 10 MG/ML IJ SOLN
INTRAMUSCULAR | Status: DC | PRN
  Administered 2014-11-08: 80 ug via INTRAVENOUS
  Administered 2014-11-08: 120 ug via INTRAVENOUS
  Administered 2014-11-08: 80 ug via INTRAVENOUS
  Administered 2014-11-08: 120 ug via INTRAVENOUS
  Administered 2014-11-08 (×3): 80 ug via INTRAVENOUS

## 2014-11-08 MED ORDER — ONDANSETRON HCL 4 MG/2ML IJ SOLN
4.0000 mg | Freq: Four times a day (QID) | INTRAMUSCULAR | Status: DC | PRN
Start: 2014-11-08 — End: 2014-11-11

## 2014-11-08 MED ORDER — 0.9 % SODIUM CHLORIDE (POUR BTL) OPTIME
TOPICAL | Status: DC | PRN
  Administered 2014-11-08: 1000 mL

## 2014-11-08 MED ORDER — ONDANSETRON HCL 4 MG/2ML IJ SOLN
INTRAMUSCULAR | Status: AC
  Filled 2014-11-08: qty 2

## 2014-11-08 SURGICAL SUPPLY — 58 items
BLADE SAG 18X100X1.27 (BLADE) ×1 IMPLANT
BLADE SAW SGTL 18X1.27X75 (BLADE) ×2 IMPLANT
BLADE SURG ROTATE 9660 (MISCELLANEOUS) IMPLANT
CAPT HIP TOTAL 2 ×1 IMPLANT
CELLS DAT CNTRL 66122 CELL SVR (MISCELLANEOUS) ×1 IMPLANT
COVER PERINEAL POST (MISCELLANEOUS) ×2 IMPLANT
COVER SURGICAL LIGHT HANDLE (MISCELLANEOUS) ×2 IMPLANT
DRAPE C-ARM 42X72 X-RAY (DRAPES) ×2 IMPLANT
DRAPE IMP U-DRAPE 54X76 (DRAPES) ×2 IMPLANT
DRAPE STERI IOBAN 125X83 (DRAPES) ×2 IMPLANT
DRAPE U-SHAPE 47X51 STRL (DRAPES) ×6 IMPLANT
DRSG AQUACEL AG ADV 3.5X10 (GAUZE/BANDAGES/DRESSINGS) ×2 IMPLANT
DURAPREP 26ML APPLICATOR (WOUND CARE) ×2 IMPLANT
ELECT BLADE 4.0 EZ CLEAN MEGAD (MISCELLANEOUS) ×2
ELECT CAUTERY BLADE 6.4 (BLADE) ×2 IMPLANT
ELECT REM PT RETURN 9FT ADLT (ELECTROSURGICAL) ×2
ELECTRODE BLDE 4.0 EZ CLN MEGD (MISCELLANEOUS) IMPLANT
ELECTRODE REM PT RTRN 9FT ADLT (ELECTROSURGICAL) ×1 IMPLANT
ELIMINATOR HOLE APEX DEPUY (Hips) ×1 IMPLANT
FACESHIELD WRAPAROUND (MASK) ×6 IMPLANT
FACESHIELD WRAPAROUND OR TEAM (MASK) ×2 IMPLANT
GLOVE BIO SURGEON STRL SZ8 (GLOVE) ×11 IMPLANT
GLOVE BIOGEL PI IND STRL 6.5 (GLOVE) IMPLANT
GLOVE BIOGEL PI IND STRL 7.5 (GLOVE) IMPLANT
GLOVE BIOGEL PI IND STRL 8 (GLOVE) ×2 IMPLANT
GLOVE BIOGEL PI INDICATOR 6.5 (GLOVE) ×1
GLOVE BIOGEL PI INDICATOR 7.5 (GLOVE) ×1
GLOVE BIOGEL PI INDICATOR 8 (GLOVE) ×2
GLOVE SURG SS PI 6.0 STRL IVOR (GLOVE) ×1 IMPLANT
GLOVE SURG SS PI 6.5 STRL IVOR (GLOVE) ×1 IMPLANT
GOWN STRL REUS W/ TWL LRG LVL3 (GOWN DISPOSABLE) ×1 IMPLANT
GOWN STRL REUS W/ TWL XL LVL3 (GOWN DISPOSABLE) ×2 IMPLANT
GOWN STRL REUS W/TWL LRG LVL3 (GOWN DISPOSABLE) ×6
GOWN STRL REUS W/TWL XL LVL3 (GOWN DISPOSABLE) ×4
KIT BASIN OR (CUSTOM PROCEDURE TRAY) ×2 IMPLANT
KIT ROOM TURNOVER OR (KITS) ×2 IMPLANT
LINER BOOT UNIVERSAL DISP (MISCELLANEOUS) ×1 IMPLANT
MANIFOLD NEPTUNE II (INSTRUMENTS) ×2 IMPLANT
NS IRRIG 1000ML POUR BTL (IV SOLUTION) ×2 IMPLANT
PACK TOTAL JOINT (CUSTOM PROCEDURE TRAY) ×2 IMPLANT
PACK UNIVERSAL I (CUSTOM PROCEDURE TRAY) ×2 IMPLANT
PAD ARMBOARD 7.5X6 YLW CONV (MISCELLANEOUS) ×5 IMPLANT
RETRACTOR WND ALEXIS 18 MED (MISCELLANEOUS) ×1 IMPLANT
RTRCTR WOUND ALEXIS 18CM MED (MISCELLANEOUS) ×2
SLEEVE SURGEON STRL (DRAPES) ×1 IMPLANT
STAPLER VISISTAT 35W (STAPLE) ×3 IMPLANT
SUT ETHIBOND NAB CT1 #1 30IN (SUTURE) ×6 IMPLANT
SUT VIC AB 0 CT1 27 (SUTURE) ×2
SUT VIC AB 0 CT1 27XBRD ANBCTR (SUTURE) IMPLANT
SUT VIC AB 1 CT1 27 (SUTURE) ×4
SUT VIC AB 1 CT1 27XBRD ANBCTR (SUTURE) ×1 IMPLANT
SUT VIC AB 2-0 CT1 27 (SUTURE) ×2
SUT VIC AB 2-0 CT1 TAPERPNT 27 (SUTURE) ×1 IMPLANT
SUT VLOC 180 0 24IN GS25 (SUTURE) ×2 IMPLANT
TOWEL OR 17X24 6PK STRL BLUE (TOWEL DISPOSABLE) ×2 IMPLANT
TOWEL OR 17X26 10 PK STRL BLUE (TOWEL DISPOSABLE) ×4 IMPLANT
TRAY FOLEY CATH 14FR (SET/KITS/TRAYS/PACK) IMPLANT
WATER STERILE IRR 1000ML POUR (IV SOLUTION) ×4 IMPLANT

## 2014-11-08 NOTE — Interval H&P Note (Signed)
OK for surgery PD 

## 2014-11-08 NOTE — Anesthesia Preprocedure Evaluation (Addendum)
Anesthesia Evaluation  Patient identified by MRN, date of birth, ID band Patient awake    Reviewed: Allergy & Precautions, NPO status , Patient's Chart, lab work & pertinent test results  Airway Mallampati: II  TM Distance: <3 FB Neck ROM: Full    Dental no notable dental hx.    Pulmonary neg pulmonary ROS, former smoker,  breath sounds clear to auscultation  Pulmonary exam normal       Cardiovascular hypertension, Normal cardiovascular examRhythm:Regular Rate:Normal     Neuro/Psych negative neurological ROS  negative psych ROS   GI/Hepatic negative GI ROS, Neg liver ROS,   Endo/Other  Morbid obesity  Renal/GU negative Renal ROS  negative genitourinary   Musculoskeletal negative musculoskeletal ROS (+)   Abdominal   Peds negative pediatric ROS (+)  Hematology negative hematology ROS (+)   Anesthesia Other Findings   Reproductive/Obstetrics negative OB ROS                            Anesthesia Physical Anesthesia Plan  ASA: III  Anesthesia Plan: Spinal   Post-op Pain Management:    Induction: Intravenous  Airway Management Planned: Simple Face Mask  Additional Equipment:   Intra-op Plan:   Post-operative Plan:   Informed Consent: I have reviewed the patients History and Physical, chart, labs and discussed the procedure including the risks, benefits and alternatives for the proposed anesthesia with the patient or authorized representative who has indicated his/her understanding and acceptance.   Dental advisory given  Plan Discussed with: CRNA  Anesthesia Plan Comments:        Anesthesia Quick Evaluation

## 2014-11-08 NOTE — Transfer of Care (Signed)
Immediate Anesthesia Transfer of Care Note  Patient: [name]  Procedure(s) Performed: Procedure(s): TOTAL HIP ARTHROPLASTY ANTERIOR APPROACH (Left)  Patient Location: PACU  Anesthesia Type:MAC and Regional  Level of Consciousness: awake, alert , oriented, patient cooperative and responds to stimulation  Airway & Oxygen Therapy: Patient Spontanous Breathing  Post-op Assessment: Report given to RN, Post -op Vital signs reviewed and stable and Patient moving all extremities X 4  Post vital signs: Reviewed and stable  Last Vitals:  Filed Vitals:   11/08/14 1507  BP:   Pulse:   Temp: 36.6 C  Resp:     Complications: No apparent anesthesia complications

## 2014-11-08 NOTE — Anesthesia Procedure Notes (Signed)
Spinal Patient location during procedure: OR Staffing Performed by: anesthesiologist  Preanesthetic Checklist Completed: patient identified, site marked, surgical consent, pre-op evaluation, timeout performed, IV checked, risks and benefits discussed and monitors and equipment checked Spinal Block Patient position: sitting Prep: Betadine Patient monitoring: heart rate, continuous pulse ox and blood pressure Location: L2-3 Injection technique: single-shot Needle Needle type: Sprotte  Needle gauge: 24 G Needle length: 9 cm Additional Notes Expiration date of kit checked and confirmed. Patient tolerated procedure well, without complications.

## 2014-11-08 NOTE — Progress Notes (Signed)
Lunch relief by MA [place] RN. Suzan Garibaldi RN had notified Dr Ermalene Postin of SBP in the 70's. Pt awake, alert, c/o pain in left hip. NS bolus infusing per Dr Ermalene Postin.  Will cont to monitor closely and med. cautiously for pain.

## 2014-11-08 NOTE — Anesthesia Postprocedure Evaluation (Signed)
  Anesthesia Post-op Note  Patient: [name]  Procedure(s) Performed: Procedure(s): TOTAL HIP ARTHROPLASTY ANTERIOR APPROACH (Left)  Patient Location: PACU  Anesthesia Type:Spinal  Level of Consciousness: awake, alert , oriented and patient cooperative  Airway and Oxygen Therapy: Patient Spontanous Breathing  Post-op Pain: none  Post-op Assessment: Post-op Vital signs reviewed, Patient's Cardiovascular Status Stable, Respiratory Function Stable, Patent Airway, No signs of Nausea or vomiting and Pain level controlled  Post-op Vital Signs: stable  Last Vitals:  Filed Vitals:   11/08/14 0923  BP: 126/79  Pulse: 80  Temp: 36.6 C  Resp: 20    Complications: No apparent anesthesia complications

## 2014-11-08 NOTE — Op Note (Signed)

## 2014-11-09 ENCOUNTER — Encounter (HOSPITAL_COMMUNITY): Payer: Self-pay | Admitting: *Deleted

## 2014-11-09 ENCOUNTER — Inpatient Hospital Stay (HOSPITAL_COMMUNITY): Payer: 59

## 2014-11-09 LAB — BASIC METABOLIC PANEL
Anion gap: 9 (ref 5–15)
BUN: 14 mg/dL (ref 6–20)
CO2: 23 mmol/L (ref 22–32)
CREATININE: 0.71 mg/dL (ref 0.61–1.24)
Calcium: 8.7 mg/dL — ABNORMAL LOW (ref 8.9–10.3)
Chloride: 101 mmol/L (ref 101–111)
GFR calc Af Amer: >60 mL/min (ref >60)
GLUCOSE: 121 mg/dL — AB (ref 70–99)
POTASSIUM: 4.2 mmol/L (ref 3.5–5.1)
Sodium: 133 mmol/L — ABNORMAL LOW (ref 135–145)

## 2014-11-09 LAB — CBC
HEMATOCRIT: 35.4 % — AB (ref 39.0–52.0)
Hemoglobin: 12.3 g/dL — ABNORMAL LOW (ref 13.0–17.0)
MCH: 30.7 pg (ref 26.0–34.0)
MCHC: 34.7 g/dL (ref 30.0–36.0)
MCV: 88.3 fL (ref 78.0–100.0)
Platelets: 149 10*3/uL — ABNORMAL LOW (ref 150–400)
RBC: 4.01 MIL/uL — AB (ref 4.22–5.81)
RDW: 13 % (ref 11.5–15.5)
WBC: 9 10*3/uL (ref 4.0–10.5)

## 2014-11-09 MED ORDER — IOHEXOL 300 MG/ML  SOLN
75.0000 mL | Freq: Once | INTRAMUSCULAR | Status: AC | PRN
  Administered 2014-11-09: 75 mL via INTRAVENOUS

## 2014-11-09 MED ORDER — HYDROCODONE-ACETAMINOPHEN 5-325 MG PO TABS
ORAL_TABLET | ORAL | Status: AC
  Filled 2014-11-09: qty 2

## 2014-11-09 NOTE — Evaluation (Signed)
Occupational Therapy Evaluation Patient Details Name: [name] MRN: [id number] DOB: Sep 04, 1945 Today's Date: 11/09/2014    History of Present Illness Patient is a 69 y/o male s/p L THA. PMH of HTN, HLD, ED.   Clinical Impression   PTA pt was independent with ADLs. Pt currently limited by decreased LLE ROM and pain which impair his independence with ADLs. Pt requires assist for LB ADLs and min guard for functional mobility. Pt will have assistance at home from hired CNA as well as family. Pt plans to purchase AE kit and has no further OT concerns. Pt has had AE before from other surgeries and is familiar with use for ADLs. Acute OT to sign off.     Follow Up Recommendations  No OT follow up    Equipment Recommendations  None recommended by OT    Recommendations for Other Services       Precautions / Restrictions Precautions Precautions: Fall Precaution Comments: Direct anterior approach, no precautions. Restrictions Weight Bearing Restrictions: Yes LLE Weight Bearing: Weight bearing as tolerated      Mobility Bed Mobility               General bed mobility comments: Pt sitting up in recliner when therapy arrived.  Transfers Overall transfer level: Needs assistance Equipment used: Rolling walker (2 wheeled) Transfers: Sit to/from Stand Sit to Stand: Min guard         General transfer comment: Min Guard to rise from recliner while simulating home toilet setup. Pt with good controlled ascent/descent and good hand placement.          ADL Overall ADL's : Needs assistance/impaired Eating/Feeding: Independent;Sitting   Grooming: Min guard;Standing   Upper Body Bathing: Set up;Sitting   Lower Body Bathing: Minimal assistance;Sit to/from stand   Upper Body Dressing : Set up;Sitting   Lower Body Dressing: Moderate assistance;Sit to/from stand   Toilet Transfer: Min guard;Ambulation;RW Toilet Transfer Details (indicate cue type and reason): simulated  home environment using "counter" (armrest) on left side and RW with RUE to stand.  Toileting- Water quality scientist and Hygiene: Min guard;Sit to/from stand   Tub/ Shower Transfer: Min guard;Tub bench;Rolling walker   Functional mobility during ADLs: Min guard;Rolling walker       Vision Additional Comments: No change from baseline          Pertinent Vitals/Pain Pain Assessment: No/denies pain     Hand Dominance     Extremity/Trunk Assessment Upper Extremity Assessment Upper Extremity Assessment: Overall WFL for tasks assessed   Lower Extremity Assessment Lower Extremity Assessment: Defer to PT evaluation   Cervical / Trunk Assessment Cervical / Trunk Assessment: Normal   Communication Communication Communication: No difficulties   Cognition Arousal/Alertness: Awake/alert Behavior During Therapy: WFL for tasks assessed/performed Overall Cognitive Status: Within Functional Limits for tasks assessed                                Home Living Family/patient expects to be discharged to:: Private residence Living Arrangements: Spouse/significant other Available Help at Discharge: Personal care attendant;Available PRN/intermittently (Hired a CNA to come 5days/wk from 10am-2pm. Wife is Pharmacist, hospital) Type of Home: House Home Access: Stairs to enter [employer] of Steps: 4 Entrance Stairs-Rails: None Home Layout: Two level;Able to live on main level with bedroom/bathroom Alternate Level Stairs-Number of Steps: 1 flight   Bathroom Shower/Tub: Tub/shower unit Shower/tub characteristics: Architectural technologist: Standard     Home Equipment:  Crutches;Tub bench   Additional Comments: pt has counter on left side beside BSC (will not fit on regular 3N1).       Prior Functioning/Environment Level of Independence: Independent        Comments: Still works.    OT Diagnosis: Generalized weakness;Acute pain    End of Session Equipment Utilized During  Treatment: Rolling walker  Activity Tolerance: Patient tolerated treatment well Patient left: in chair;with call bell/phone within reach   Time: 1156-1216 OT Time Calculation (min): 20 min Charges:  OT General Charges $OT Visit: 1 Procedure OT Evaluation $Initial OT Evaluation Tier I: 1 Procedure G-Codes:    Juluis Rainier 11-18-14, 1:53 PM  Cyndie Chime, OTR/L Occupational Therapist [phone] (pager)

## 2014-11-09 NOTE — Progress Notes (Signed)
Physical Therapy Treatment Patient Details Name: [name] MRN: [id number] DOB: 17-Aug-1945 Today's Date: 11/09/2014    History of Present Illness Patient is a 69 y/o male s/p L THA. PMH of HTN, HLD, ED.    PT Comments    Patient progressing slowly towards PT goals. Pt unhappy during session due to no tech coming to help him to the bathroom this PM. Increased pain limiting ambulation and mobility. Reviewed HEP. Tolerated short distance ambulation. Encouraged OOB to chair for all meals. Will continue to follow and progress as tolerated. Important to coincide therapy after pain medications to improve activity tolerance.   Follow Up Recommendations  Home health PT;Supervision/Assistance - 24 hour     Equipment Recommendations  Rolling walker with 5" wheels    Recommendations for Other Services       Precautions / Restrictions Precautions Precautions: Fall Precaution Booklet Issued: No Precaution Comments: Direct anterior approach, no precautions. Restrictions Weight Bearing Restrictions: Yes LLE Weight Bearing: Weight bearing as tolerated    Mobility  Bed Mobility Overal bed mobility: Needs Assistance Bed Mobility: Sit to Supine       Sit to supine: Min assist   General bed mobility comments: Min A to bring LLE into bed. Cues for technique. Able to scoot self up in bed with bed in Trendelenberg  Transfers Overall transfer level: Needs assistance Equipment used: Rolling walker (2 wheeled) Transfers: Sit to/from Stand Sit to Stand: Min guard         General transfer comment: Min Guard to rise from recliner and from toilet x1.   Ambulation/Gait Ambulation/Gait assistance: Min guard Ambulation Distance (Feet): 12 Feet (x2 bouts) Assistive device: Rolling walker (2 wheeled) Gait Pattern/deviations: Step-to pattern;Decreased stance time - left;Decreased step length - right;Trunk flexed   Gait velocity interpretation: Below normal speed for age/gender General  Gait Details: Cues for upright posture. Increased stiffness in left hip and increased pain limiting distance.   Stairs            Wheelchair Mobility    Modified Rankin (Stroke Patients Only)       Balance Overall balance assessment: Needs assistance Sitting-balance support: Feet supported;No upper extremity supported Sitting balance-Leahy Scale: Fair     Standing balance support: During functional activity Standing balance-Leahy Scale: Poor                      Cognition Arousal/Alertness: Awake/alert Behavior During Therapy: WFL for tasks assessed/performed Overall Cognitive Status: Within Functional Limits for tasks assessed                      Exercises Total Joint Exercises Ankle Circles/Pumps: Both;15 reps;Seated Quad Sets: Left;10 reps;Supine Hip ABduction/ADduction: Left;Supine;5 reps Long Arc Quad: Left;5 reps;Seated    General Comments        Pertinent Vitals/Pain Pain Assessment: Faces Faces Pain Scale: Hurts even more Pain Location: left hip with movement Pain Descriptors / Indicators: Sore;Aching Pain Intervention(s): Limited activity within patient's tolerance;Monitored during session;Repositioned;Premedicated before session    Home Living Family/patient expects to be discharged to:: Private residence Living Arrangements: Spouse/significant other Available Help at Discharge: Personal care attendant;Available PRN/intermittently (Hired a CNA to come 5days/wk from 10am-2pm. Wife is Pharmacist, hospital) Type of Home: House Home Access: Stairs to enter Entrance Stairs-Rails: None Home Layout: Two level;Able to live on main level with bedroom/bathroom Home Equipment: Crutches;Tub bench Additional Comments: pt has counter on left side beside BSC (will not fit on regular 3N1).  Prior Function Level of Independence: Independent      Comments: Still works.   PT Goals (current goals can now be found in the care plan section) Progress  towards PT goals: Progressing toward goals    Frequency  BID    PT Plan Current plan remains appropriate    Co-evaluation             End of Session Equipment Utilized During Treatment: Gait belt Activity Tolerance: Patient limited by pain Patient left: in bed;with call bell/phone within reach;with bed alarm set     Time: 1341-1408 PT Time Calculation (min) (ACUTE ONLY): 27 min  Charges:  $Therapeutic Exercise: 8-22 mins $Therapeutic Activity: 8-22 mins                    G Codes:      Wealthy Danielski A Blaize Epple 11/09/2014, 2:56 PM  Wray Kearns, [place], DPT [phone]

## 2014-11-09 NOTE — Evaluation (Signed)
Physical Therapy Evaluation Patient Details Name: [name] MRN: [id number] DOB: [birth year] Today's Date: 11/09/2014   History of Present Illness  Patient is a 69 y/o male s/p L THA. PMH of HTN, HLD, ED.  Clinical Impression  Patient presents with pain and post surgical deficits LLE s/p above surgery impacting mobility. Tolerated short distance ambulation with Min guard assist for safety. Reviewed HEP. Pt will have some assist at home from hired CNA and family at discharge. Will need to be able to negotiate 4 steps to enter home. Would benefit from skilled PT to improve transfers, gait, balance and mobility so pt can maximize independence and return to PLOF.    Follow Up Recommendations Home health PT;Supervision/Assistance - 24 hour    Equipment Recommendations  3in1 (PT);Rolling walker with 5" wheels    Recommendations for Other Services       Precautions / Restrictions Precautions Precautions: Fall;Anterior Hip Precaution Booklet Issued: No Precaution Comments: Direct anterior approach, no precautions. Restrictions Weight Bearing Restrictions: Yes LLE Weight Bearing: Weight bearing as tolerated      Mobility  Bed Mobility Overal bed mobility: Needs Assistance Bed Mobility: Supine to Sit     Supine to sit: Mod assist;HOB elevated     General bed mobility comments: Mod A to elevate trunk and to bring LLE to EOB. Use of rail for support. Sat EOB ~8 minutes due to "wooziness."  Transfers Overall transfer level: Needs assistance Equipment used: Rolling walker (2 wheeled) Transfers: Sit to/from Stand Sit to Stand: Min guard         General transfer comment: Min guard to rise from EOB x1 with cues for hand placement, x1 from elevated toilet. Attempted to have BM however unsuccessful. Stood from [facility] and toilet multiple times to try to adjust [facility] for comfort x4. Anxious about standing.  Ambulation/Gait Ambulation/Gait assistance: Min guard Ambulation Distance  (Feet): 12 Feet (x2 bouts) Assistive device: Rolling walker (2 wheeled) Gait Pattern/deviations: Decreased stance time - left;Decreased step length - right;Step-to pattern;Decreased stride length;Trunk flexed   Gait velocity interpretation: Below normal speed for age/gender General Gait Details: Pt with difficulty advancing LLE dragging it at times. Very slow, guarded gait. Cues for RW management.  Stairs            Wheelchair Mobility    Modified Rankin (Stroke Patients Only)       Balance Overall balance assessment: Needs assistance Sitting-balance support: Feet supported;No upper extremity supported Sitting balance-Leahy Scale: Fair     Standing balance support: During functional activity Standing balance-Leahy Scale: Poor Standing balance comment: Relient on RW.                             Pertinent Vitals/Pain Pain Assessment: Faces Faces Pain Scale: Hurts little more Pain Location: left hip with movement. Pain Descriptors / Indicators: Sore;Aching Pain Intervention(s): Limited activity within patient's tolerance;Monitored during session;Premedicated before session;Repositioned    Home Living Family/patient expects to be discharged to:: Private residence Living Arrangements: Spouse/significant other Available Help at Discharge: Personal care attendant;Available PRN/intermittently (Hired a CNA to come 5 days/qwwk from 10-2pm. Wife is a Pharmacist, hospital.) Type of Home: House Home Access: Stairs to enter Entrance Stairs-Rails: None Entrance Stairs-Number of Steps: 4 Home Layout: Two level;Able to live on main level with bedroom/bathroom Home Equipment: Crutches      Prior Function Level of Independence: Independent         Comments: Still works.  Hand Dominance        Extremity/Trunk Assessment   Upper Extremity Assessment: Defer to OT evaluation           Lower Extremity Assessment: LLE deficits/detail   LLE Deficits / Details:  Limited AROM hip, knee secondary to pain. Grossly ~2+/5 knee extension.      Communication   Communication: No difficulties  Cognition Arousal/Alertness: Awake/alert Behavior During Therapy: WFL for tasks assessed/performed Overall Cognitive Status: Within Functional Limits for tasks assessed                      General Comments      Exercises Total Joint Exercises Ankle Circles/Pumps: Both;15 reps;Seated Quad Sets: Left;5 reps;Seated Gluteal Sets: Both;5 reps;Seated Long Arc Quad: Left;5 reps;Seated      Assessment/Plan    PT Assessment Patient needs continued PT services  PT Diagnosis Acute pain;Difficulty walking   PT Problem List Decreased strength;Pain;Decreased range of motion;Decreased activity tolerance;Decreased balance;Decreased mobility;Decreased knowledge of use of DME  PT Treatment Interventions Balance training;DME instruction;Gait training;Stair training;Patient/family education;Functional mobility training;Therapeutic activities;Therapeutic exercise   PT Goals (Current goals can be found in the Care Plan section) Acute Rehab PT Goals Patient Stated Goal: to return to independence PT Goal Formulation: With patient Time For Goal Achievement: 11/23/14 Potential to Achieve Goals: Good    Frequency BID   Barriers to discharge Decreased caregiver support Pt will have CNA come in for a few hours daily and then a family member will be with him at home next week. Wife works as Pharmacist, hospital.    Co-evaluation               End of Session Equipment Utilized During Treatment: Gait belt Activity Tolerance: Patient tolerated treatment well Patient left: in chair;with call bell/phone within reach Nurse Communication: Mobility status;Other (comment) (Need for bariatric [facility].)         Time: 0915-1015 PT Time Calculation (min) (ACUTE ONLY): 60 min   Charges:   PT Evaluation $Initial PT Evaluation Tier I: 1 Procedure PT Treatments $Therapeutic  Exercise: 8-22 mins $Therapeutic Activity: 23-37 mins   PT G Codes:        Maimouna Rondeau A Shannette Tabares 11/09/2014, 10:48 AM  Wray Kearns, PT, DPT [phone]

## 2014-11-09 NOTE — Care Management Note (Signed)
Case Management Note  Patient Details  Name: ADRIN JULIAN MRN: [id number] Date of Birth: 1945/08/22  Subjective/Objective:      Patient admitted with osteoarthritis of his left hip. Patient underwent a left total hip arthroplasty.              Action/Plan:  Case manager spoke with patient concerning home health needs and DME. Patient preoperatively setup with [facility], no changes. Patient states he has family assistance at discharge.      Expected Discharge Date: 11/10/14                  Expected Discharge Plan: Home with Home Health. In-House Referral:     Discharge planning Services  CM Consult  Post Acute Care Choice:  Home Health, Durable Medical Equipment Choice offered to:  Patient  DME Arranged:  Walker rolling DME Agency:  [employer]  HH Arranged:  [place] Agency:  [place]  Status of Service:  Completed, signed off  Medicare Important Message Given:    Date Medicare IM Given:    Medicare IM give by:    Date Additional Medicare IM Given:    Additional Medicare Important Message give by:     If discussed at [place] of Stay Meetings, dates discussed:    Additional Comments:  Ninfa Meeker, RN 11/09/2014, 11:02 AM

## 2014-11-09 NOTE — Progress Notes (Signed)
Subjective: 1 Day Post-Op Procedure(s) (LRB): TOTAL HIP ARTHROPLASTY ANTERIOR APPROACH (Left)  Activity level:  wbat Diet tolerance:  ok Voiding:  ok Patient reports pain as mild and moderate.    Objective: Vital signs in last 24 hours: Temp:  [97.2 F (36.2 C)-98.5 F (36.9 C)] 98.5 F (36.9 C) (05/11 0600) Pulse Rate:  [51-80] 65 (05/11 0600) Resp:  [7-20] 18 (05/11 0600) BP: (74-126)/(23-79) 114/62 mmHg (05/11 0600) SpO2:  [94 %-100 %] 95 % (05/11 0600) Weight:  [129.275 kg (285 lb)] 129.275 kg (285 lb) (05/10 0923)  Labs:  Recent Labs  11/09/14 0528  HGB 12.3*    Recent Labs  11/09/14 0528  WBC 9.0  RBC 4.01*  HCT 35.4*  PLT 149*    Recent Labs  11/09/14 0528  NA 133*  K 4.2  CL 101  CO2 23  BUN 14  CREATININE 0.71  GLUCOSE 121*  CALCIUM 8.7*   No results for input(s): LABPT, INR in the last 72 hours.  Physical Exam:  Neurologically intact ABD soft Neurovascular intact Sensation intact distally Intact pulses distally Dorsiflexion/Plantar flexion intact Incision: dressing C/D/I and no drainage No cellulitis present Compartment soft  Assessment/Plan:  1 Day Post-Op Procedure(s) (LRB): TOTAL HIP ARTHROPLASTY ANTERIOR APPROACH (Left) Advance diet Up with therapy D/C IV fluids Plan for discharge tomorrow Discharge home with home health  Continue on ASA 325mg  BIDx 4 weeks. Follow up in office 2 weeks post op. Will get Chest CT scan as reccomended by radiology due to abnormality on pre op chest x-ray.    Farah Benish, Larwance Sachs 11/09/2014, 7:49 AM

## 2014-11-10 LAB — CBC
HCT: 34.1 % — ABNORMAL LOW (ref 39.0–52.0)
Hemoglobin: 11.5 g/dL — ABNORMAL LOW (ref 13.0–17.0)
MCH: 30 pg (ref 26.0–34.0)
MCHC: 33.7 g/dL (ref 30.0–36.0)
MCV: 89 fL (ref 78.0–100.0)
Platelets: 131 10*3/uL — ABNORMAL LOW (ref 150–400)
RBC: 3.83 MIL/uL — ABNORMAL LOW (ref 4.22–5.81)
RDW: 13 % (ref 11.5–15.5)
WBC: 8.7 10*3/uL (ref 4.0–10.5)

## 2014-11-10 NOTE — Progress Notes (Signed)
Physical Therapy Treatment Patient Details Name: [name] MRN: [id number] DOB: 01/03/1946 Today's Date: 11/10/2014    History of Present Illness Patient is a 69 y/o male s/p L THA. PMH of HTN, HLD, ED.    PT Comments    Patient continues to be limited by some pain and some by fear of pain with mobility. Work on increasing ambulation distance and confidence this afternoon. Anticipate DC tomorrow.   Follow Up Recommendations  Home health PT;Supervision/Assistance - 24 hour     Equipment Recommendations  Rolling walker with 5" wheels    Recommendations for Other Services       Precautions / Restrictions Precautions Precautions: Fall Precaution Comments: Direct anterior approach, no precautions. Restrictions Weight Bearing Restrictions: Yes LLE Weight Bearing: Weight bearing as tolerated    Mobility  Bed Mobility           Sit to supine: Min guard   General bed mobility comments: MG but relying heavily on rails. Will need to attempt without rails prior to DC home  Transfers Overall transfer level: Needs assistance Equipment used: Rolling walker (2 wheeled)   Sit to Stand: Min guard         General transfer comment: MG for safety. Cues for hand placement  Ambulation/Gait Ambulation/Gait assistance: Min guard Ambulation Distance (Feet): 50 Feet Assistive device: Rolling walker (2 wheeled) Gait Pattern/deviations: Step-to pattern;Decreased step length - right;Decreased stance time - left;Trunk flexed   Gait velocity interpretation: Below normal speed for age/gender General Gait Details: Cues for upright posture. Increased stiffness in left hip.    Stairs            Wheelchair Mobility    Modified Rankin (Stroke Patients Only)       Balance                                    Cognition Arousal/Alertness: Awake/alert Behavior During Therapy: WFL for tasks assessed/performed Overall Cognitive Status: Within Functional Limits  for tasks assessed                      Exercises Total Joint Exercises Long Arc Quad: Seated;10 reps;Both    General Comments        Pertinent Vitals/Pain Pain Score: 5  Pain Location: L hip stiffness Pain Descriptors / Indicators: Sore Pain Intervention(s): Limited activity within patient's tolerance;Monitored during session    Home Living                      Prior Function            PT Goals (current goals can now be found in the care plan section) Progress towards PT goals: Progressing toward goals    Frequency  BID    PT Plan Current plan remains appropriate    Co-evaluation             End of Session   Activity Tolerance: Patient limited by pain Patient left: in chair;with call bell/phone within reach;with family/visitor present     Time: 1001-1025 PT Time Calculation (min) (ACUTE ONLY): 24 min  Charges:  $Gait Training: 8-22 mins $Therapeutic Activity: 8-22 mins                    G Codes:      Jacqualyn Posey 11/10/2014, 11:00 AM 11/10/2014 Jacqualyn Posey PTA [phone] pager [phone] office

## 2014-11-10 NOTE — Progress Notes (Signed)
Physical Therapy Treatment Patient Details Name: [name] MRN: [id number] DOB: September 06, 1945 Today's Date: 11/10/2014    History of Present Illness Patient is a 69 y/o male s/p L THA. PMH of HTN, HLD, ED.    PT Comments    Patient requires increased time due to pain and stiffness. Able to increase hall ambulation this afternoon. Will need to practice steps prior to DC home tomorrow.   Follow Up Recommendations  Home health PT;Supervision/Assistance - 24 hour     Equipment Recommendations  Rolling walker with 5" wheels    Recommendations for Other Services       Precautions / Restrictions Precautions Precautions: Fall Precaution Comments: Direct anterior approach, no precautions. Restrictions LLE Weight Bearing: Weight bearing as tolerated    Mobility  Bed Mobility Overal bed mobility: Needs Assistance         Sit to supine: Min assist   General bed mobility comments: Min A for LEs back into bed.   Transfers Overall transfer level: Needs assistance Equipment used: Rolling walker (2 wheeled)   Sit to Stand: Min guard         General transfer comment: MG for safety. Cues for hand placement  Ambulation/Gait Ambulation/Gait assistance: Min guard Ambulation Distance (Feet): 100 Feet Assistive device: Rolling walker (2 wheeled) Gait Pattern/deviations: Step-through pattern;Decreased stride length;Trunk flexed Gait velocity: guarded Gait velocity interpretation: Below normal speed for age/gender General Gait Details: cues for upright posture and positioning of RW   Stairs            Wheelchair Mobility    Modified Rankin (Stroke Patients Only)       Balance                                    Cognition Arousal/Alertness: Awake/alert Behavior During Therapy: WFL for tasks assessed/performed Overall Cognitive Status: Within Functional Limits for tasks assessed                      Exercises      General Comments        Pertinent Vitals/Pain Faces Pain Scale: Hurts little more Pain Location: L hip stiffness Pain Descriptors / Indicators: Sore;Aching Pain Intervention(s): Monitored during session;Repositioned    Home Living                      Prior Function            PT Goals (current goals can now be found in the care plan section) Progress towards PT goals: Progressing toward goals    Frequency  BID    PT Plan Current plan remains appropriate    Co-evaluation             End of Session   Activity Tolerance: Patient tolerated treatment well Patient left: in bed;with call bell/phone within reach;with family/visitor present     Time: 7846-9629 PT Time Calculation (min) (ACUTE ONLY): 33 min  Charges:  $Gait Training: 8-22 mins $Therapeutic Activity: 8-22 mins                    G Codes:      Jacqualyn Posey 11/10/2014, 3:02 PM  11/10/2014 Jacqualyn Posey PTA [phone] pager [phone] office

## 2014-11-10 NOTE — Progress Notes (Signed)
Subjective: 2 Days Post-Op Procedure(s) (LRB): TOTAL HIP ARTHROPLASTY ANTERIOR APPROACH (Left)  Activity level:  WBAT Diet tolerance:  ok Voiding:  ok Patient reports pain as mild.    Objective: Vital signs in last 24 hours: Temp:  [98.2 F (36.8 C)-99.1 F (37.3 C)] 98.2 F (36.8 C) (05/12 0508) Pulse Rate:  [76-86] 76 (05/12 0508) Resp:  [17-18] 18 (05/12 0508) BP: (108-131)/(60-70) 109/69 mmHg (05/12 0508) SpO2:  [95 %-99 %] 96 % (05/12 0508)  Labs:  Recent Labs  11/09/14 0528 11/10/14 0507  HGB 12.3* 11.5*    Recent Labs  11/09/14 0528 11/10/14 0507  WBC 9.0 8.7  RBC 4.01* 3.83*  HCT 35.4* 34.1*  PLT 149* 131*    Recent Labs  11/09/14 0528  NA 133*  K 4.2  CL 101  CO2 23  BUN 14  CREATININE 0.71  GLUCOSE 121*  CALCIUM 8.7*   No results for input(s): LABPT, INR in the last 72 hours.  Physical Exam:  Neurologically intact ABD soft Neurovascular intact Sensation intact distally Intact pulses distally Dorsiflexion/Plantar flexion intact Incision: dressing C/D/I and no drainage No cellulitis present Compartment soft  Assessment/Plan:  2 Days Post-Op Procedure(s) (LRB): TOTAL HIP ARTHROPLASTY ANTERIOR APPROACH (Left) Advance diet Up with therapy Plan for discharge tomorrow Discharge home with home health if doing well and cleared by PT Continue on ASA 325mg  BID x 4 weeks. Follow up in office 2 weeks post op.    Lorriann Hansmann, Larwance Sachs 11/10/2014, 8:27 AM

## 2014-11-11 LAB — CBC
HCT: 33.9 % — ABNORMAL LOW (ref 39.0–52.0)
HEMOGLOBIN: 11.4 g/dL — AB (ref 13.0–17.0)
MCH: 30.1 pg (ref 26.0–34.0)
MCHC: 33.6 g/dL (ref 30.0–36.0)
MCV: 89.4 fL (ref 78.0–100.0)
PLATELETS: 135 10*3/uL — AB (ref 150–400)
RBC: 3.79 MIL/uL — ABNORMAL LOW (ref 4.22–5.81)
RDW: 13.1 % (ref 11.5–15.5)
WBC: 8.8 10*3/uL (ref 4.0–10.5)

## 2014-11-11 MED ORDER — ASPIRIN 325 MG PO TBEC: 325.0000 mg | DELAYED_RELEASE_TABLET | Freq: Two times a day (BID) | ORAL | Status: DC

## 2014-11-11 MED ORDER — METHOCARBAMOL 500 MG PO TABS: 500.0000 mg | ORAL_TABLET | Freq: Four times a day (QID) | ORAL | Status: DC | PRN

## 2014-11-11 MED ORDER — HYDROCODONE-ACETAMINOPHEN 5-325 MG PO TABS: 1.0000 | ORAL_TABLET | ORAL | Status: DC | PRN

## 2014-11-11 NOTE — Discharge Summary (Signed)
Patient ID: [name] MRN: [id number] DOB/AGE: 1946/02/03 69 y.o.  Admit date: 11/08/2014 Discharge date: 11/11/2014  Admission Diagnoses:  Principal Problem:   Primary osteoarthritis of left hip   Discharge Diagnoses:  Same  Past Medical History  Diagnosis Date  . Hyperlipidemia     takes Simvastatin daily  . Osteoarthritis     knee  . ED (erectile dysfunction)     takes Viagra as needed  . Hypertension     takes Lisinopril daily  . Joint pain   . History of bronchitis last time about 75yrs ago    Surgeries: Procedure(s): TOTAL HIP ARTHROPLASTY ANTERIOR APPROACH on 11/08/2014   Consultants:    Discharged Condition: Improved  Hospital Course: JAHMERE BRAMEL is an 69 y.o. male who was admitted 11/08/2014 for operative treatment ofPrimary osteoarthritis of left hip. Patient has severe unremitting pain that affects sleep, daily activities, and work/hobbies. After pre-op clearance the patient was taken to the operating room on 11/08/2014 and underwent  Procedure(s): TOTAL HIP ARTHROPLASTY ANTERIOR APPROACH.    Patient was given perioperative antibiotics: Anti-infectives    Start     Dose/Rate Route Frequency Ordered Stop   11/08/14 2000  ceFAZolin (ANCEF) 3 g in dextrose 5 % 50 mL IVPB     3 g 160 mL/hr over 30 Minutes Intravenous Every 6 hours 11/08/14 1859 11/09/14 0250   11/08/14 1100  ceFAZolin (ANCEF) 3 g in dextrose 5 % 50 mL IVPB     3 g 160 mL/hr over 30 Minutes Intravenous To [facility]Stay [facility] 11/07/14 1558 11/08/14 1133       Patient was given sequential compression devices, early ambulation, and chemoprophylaxis to prevent DVT.  Patient benefited maximally from hospital stay and there were no complications.    Recent vital signs: Patient Vitals for the past 24 hrs:  BP Temp Temp src Pulse Resp SpO2  11/11/14 0622 125/73 mmHg 97.7 F (36.5 C) Oral 78 16 97 %  11/10/14 2026 126/77 mmHg (!) 100.5 F (38.1 C) Oral (!) 101 17 98 %  11/10/14 1400 119/66  mmHg 98.4 F (36.9 C) - - 18 96 %     Recent laboratory studies:  Recent Labs  11/09/14 0528 11/10/14 0507 11/11/14 0318  WBC 9.0 8.7 8.8  HGB 12.3* 11.5* 11.4*  HCT 35.4* 34.1* 33.9*  PLT 149* 131* 135*  NA 133*  --   --   K 4.2  --   --   CL 101  --   --   CO2 23  --   --   BUN 14  --   --   CREATININE 0.71  --   --   GLUCOSE 121*  --   --   CALCIUM 8.7*  --   --      Discharge Medications:     Medication List    STOP taking these medications        aspirin 81 MG tablet  Replaced by:  aspirin 325 MG EC tablet     diclofenac 50 MG tablet  Commonly known as:  CATAFLAM     naproxen sodium 220 MG tablet  Commonly known as:  ANAPROX      TAKE these medications        aspirin 325 MG EC tablet  Take 1 tablet (325 mg total) by mouth 2 (two) times daily after a meal.     cyanocobalamin 1000 MCG tablet  Take 100 mcg by mouth daily.     Fish  Oil 500 MG Caps  Take 500 mg by mouth daily.     HYDROcodone-acetaminophen 5-325 MG per tablet  Commonly known as:  NORCO/VICODIN  Take 1-2 tablets by mouth every 4 (four) hours as needed (breakthrough pain).     lisinopril 10 MG tablet  Commonly known as:  PRINIVIL,ZESTRIL  Take 10 mg by mouth daily.     methocarbamol 500 MG tablet  Commonly known as:  ROBAXIN  Take 1 tablet (500 mg total) by mouth every 6 (six) hours as needed for muscle spasms.     MULTI VITAMIN DAILY PO  Take 1 tablet by mouth daily.     simvastatin 20 MG tablet  Commonly known as:  ZOCOR  Take 20 mg by mouth daily at 6 PM.     VIAGRA 100 MG tablet  Generic drug:  sildenafil  Take 100 mg by mouth daily as needed for erectile dysfunction.     VITAMIN C PO  Take 1 tablet by mouth daily.     VITAMIN D-3 PO  Take 1 tablet by mouth daily.     VITAMIN E PO  Take 1 tablet by mouth daily.        Diagnostic Studies: Dg Chest 2 View  10/27/2014   CLINICAL DATA:  Total hip arthroplasty  EXAM: CHEST  2 VIEW  COMPARISON:  None.  FINDINGS: Mild  cardiomegaly. Low volumes. Normal vascularity. Left basilar nodular density. No effusion or pneumothorax. There is pleural thickening along the mid lateral hemithoraces that is nonspecific and likely chronic.  IMPRESSION: Left basilar nodular density.  CT chest is recommended.   Electronically Signed   By: Marybelle Killings M.D.   On: 10/27/2014 10:53   Ct Chest W Contrast  11/09/2014   CLINICAL DATA:  Abnormal chest Xray. Pt was a smoker but quit back in 1993. Questionable left lower lung zone nodule on the recent prior chest radiograph.  EXAM: CT CHEST WITH CONTRAST  TECHNIQUE: Multidetector CT imaging of the chest was performed during intravenous contrast administration.  CONTRAST:  61mL OMNIPAQUE IOHEXOL 300 MG/ML  SOLN  COMPARISON:  10/27/2014  FINDINGS: Thoracic inlet:  Normal.  Mediastinum and hila: Heart normal in size and configuration. No coronary artery calcifications. Great vessels are normal in caliber. No atherosclerotic plaque. No mediastinal or hilar masses or pathologically enlarged lymph nodes.  Lungs and pleura: No lung mass or suspicious nodule. Small calcified granuloma in the right middle lobe. Minimal dependent subsegmental atelectasis. No lung consolidation or edema. No pleural effusion. The subtle opacity noted along the left heart border on the prior chest radiograph was likely due to prominent mediastinal fat. There is significant fat surrounding the heart and mediastinal structures as well as some more prominent subpleural fat along both hemithoraces.  Limited upper abdomen: Status post cholecystectomy. Hepatic steatosis. Otherwise unremarkable.  Musculoskeletal: Disc degenerative changes, relatively mild, throughout the visualized spine. No osteoblastic or osteolytic lesions  IMPRESSION: 1. No evidence of a lung mass or suspicious nodule. 2. No acute findings. 3. Small right middle lobe calcified granuloma. Hepatic steatosis. Status post cholecystectomy. Mild degenerative changes  throughout the thoracic spine. No other abnormalities.   Electronically Signed   By: Lajean Manes M.D.   On: 11/09/2014 09:29   Dg Hip Operative Unilat With Pelvis Left  11/08/2014   CLINICAL DATA:  Left total hip arthroplasty.  EXAM: OPERATIVE left HIP (WITH PELVIS IF PERFORMED) 1 VIEWS  TECHNIQUE: Fluoroscopic spot image(s) were submitted for interpretation post-operatively.  FLUOROSCOPY TIME:  Radiation Exposure Index (as provided by the fluoroscopic device):  If the device does not provide the exposure index:  Fluoroscopy Time:  50 second  Number of Acquired Images:  1  COMPARISON:  None.  FINDINGS: The patient is status post left total hip arthroplasty. The hardware components are in anatomic alignment and there are no complications. There is no periprosthetic fracture or dislocation identified.  IMPRESSION: 1. No complications after left hip arthroplasty.   Electronically Signed   By: Kerby Moors M.D.   On: 11/08/2014 13:46    Disposition: Final discharge disposition not confirmed      Discharge Instructions    Call MD / Call 911    Complete by:  As directed   If you experience chest pain or shortness of breath, CALL 911 and be transported to the hospital emergency room.  If you develope a fever above 101 F, pus (white drainage) or increased drainage or redness at the wound, or calf pain, call your surgeon's office.     Constipation Prevention    Complete by:  As directed   Drink plenty of fluids.  Prune juice may be helpful.  You may use a stool softener, such as Colace (over the counter) 100 mg twice a day.  Use MiraLax (over the counter) for constipation as needed.     Diet - low sodium heart healthy    Complete by:  As directed      Discharge instructions    Complete by:  As directed   INSTRUCTIONS AFTER JOINT REPLACEMENT   Remove items at home which could result in a fall. This includes throw rugs or furniture in walking pathways ICE to the affected joint every three hours while  awake for 30 minutes at a time, for at least the first 3-5 days, and then as needed for pain and swelling.  Continue to use ice for pain and swelling. You may notice swelling that will progress down to the foot and ankle.  This is normal after surgery.  Elevate your leg when you are not up walking on it.   Continue to use the breathing machine you got in the hospital (incentive spirometer) which will help keep your temperature down.  It is common for your temperature to cycle up and down following surgery, especially at night when you are not up moving around and exerting yourself.  The breathing machine keeps your lungs expanded and your temperature down.   DIET:  As you were doing prior to hospitalization, we recommend a well-balanced diet.  DRESSING / WOUND CARE / SHOWERING  Keep bandage clean and dry until follow up.  ACTIVITY  Increase activity slowly as tolerated, but follow the weight bearing instructions below.   No driving for 6 weeks or until further direction given by your physician.  You cannot drive while taking narcotics.  No lifting or carrying greater than 10 lbs. until further directed by your surgeon. Avoid periods of inactivity such as sitting longer than an hour when not asleep. This helps prevent blood clots.  You may return to work once you are authorized by your doctor.     WEIGHT BEARING   Weight bearing as tolerated with assist device (walker, cane, etc) as directed, use it as long as suggested by your surgeon or therapist, typically at least 4-6 weeks.   EXERCISES  Results after joint replacement surgery are often greatly improved when you follow the exercise, range of motion and muscle strengthening exercises prescribed by your doctor.  Safety measures are also important to protect the joint from further injury. Any time any of these exercises cause you to have increased pain or swelling, decrease what you are doing until you are comfortable again and then slowly  increase them. If you have problems or questions, call your caregiver or physical therapist for advice.   Rehabilitation is important following a joint replacement. After just a few days of immobilization, the muscles of the leg can become weakened and shrink (atrophy).  These exercises are designed to build up the tone and strength of the thigh and leg muscles and to improve motion. Often times heat used for twenty to thirty minutes before working out will loosen up your tissues and help with improving the range of motion but do not use heat for the first two weeks following surgery (sometimes heat can increase post-operative swelling).   These exercises can be done on a training (exercise) mat, on the floor, on a table or on a bed. Use whatever works the best and is most comfortable for you.    Use music or television while you are exercising so that the exercises are a pleasant break in your day. This will make your life better with the exercises acting as a break in your routine that you can look forward to.   Perform all exercises about fifteen times, three times per day or as directed.  You should exercise both the operative leg and the other leg as well.   Exercises include:   Quad Sets - Tighten up the muscle on the front of the thigh (Quad) and hold for 5-10 seconds.   Straight Leg Raises - With your knee straight (if you were given a brace, keep it on), lift the leg to 60 degrees, hold for 3 seconds, and slowly lower the leg.  Perform this exercise against resistance later as your leg gets stronger.  Leg Slides: Lying on your back, slowly slide your foot toward your buttocks, bending your knee up off the floor (only go as far as is comfortable). Then slowly slide your foot back down until your leg is flat on the floor again.  Angel Wings: Lying on your back spread your legs to the side as far apart as you can without causing discomfort.  Hamstring Strength:  Lying on your back, push your heel  against the floor with your leg straight by tightening up the muscles of your buttocks.  Repeat, but this time bend your knee to a comfortable angle, and push your heel against the floor.  You may put a pillow under the heel to make it more comfortable if necessary.   A rehabilitation program following joint replacement surgery can speed recovery and prevent re-injury in the future due to weakened muscles. Contact your doctor or a physical therapist for more information on knee rehabilitation.    CONSTIPATION  Constipation is defined medically as fewer than three stools per week and severe constipation as less than one stool per week.  Even if you have a regular bowel pattern at home, your normal regimen is likely to be disrupted due to multiple reasons following surgery.  Combination of anesthesia, postoperative narcotics, change in appetite and fluid intake all can affect your bowels.   YOU MUST use at least one of the following options; they are listed in order of increasing strength to get the job done.  They are all available over the counter, and you may need to use some, POSSIBLY even all of  these options:    Drink plenty of fluids (prune juice may be helpful) and high fiber foods Colace 100 mg by mouth twice a day  Senokot for constipation as directed and as needed Dulcolax (bisacodyl), take with full glass of water  Miralax (polyethylene glycol) once or twice a day as needed.  If you have tried all these things and are unable to have a bowel movement in the first 3-4 days after surgery call either your surgeon or your primary doctor.    If you experience loose stools or diarrhea, hold the medications until you stool forms back up.  If your symptoms do not get better within 1 week or if they get worse, check with your doctor.  If you experience "the worst abdominal pain ever" or develop nausea or vomiting, please contact the office immediately for further recommendations for  treatment.   ITCHING:  If you experience itching with your medications, try taking only a single pain pill, or even half a pain pill at a time.  You can also use Benadryl over the counter for itching or also to help with sleep.   TED HOSE STOCKINGS:  Use stockings on both legs until for at least 2 weeks or as directed by physician office. They may be removed at night for sleeping.  MEDICATIONS:  See your medication summary on the "After Visit Summary" that nursing will review with you.  You may have some home medications which will be placed on hold until you complete the course of blood thinner medication.  It is important for you to complete the blood thinner medication as prescribed.  PRECAUTIONS:  If you experience chest pain or shortness of breath - call 911 immediately for transfer to the hospital emergency department.   If you develop a fever greater that 101 F, purulent drainage from wound, increased redness or drainage from wound, foul odor from the wound/dressing, or calf pain - CONTACT YOUR SURGEON.                                                   FOLLOW-UP APPOINTMENTS:  If you do not already have a post-op appointment, please call the office for an appointment to be seen by your surgeon.  Guidelines for how soon to be seen are listed in your "After Visit Summary", but are typically between 1-4 weeks after surgery.  OTHER INSTRUCTIONS:   Knee Replacement:  Do not place pillow under knee, focus on keeping the knee straight while resting. CPM instructions: 0-90 degrees, 2 hours in the morning, 2 hours in the afternoon, and 2 hours in the evening. Place foam block, curve side up under heel at all times except when in CPM or when walking.  DO NOT modify, tear, cut, or change the foam block in any way.  MAKE SURE YOU:  Understand these instructions.  Get help right away if you are not doing well or get worse.    Thank you for letting us be a part of your medical care team.  It is a  privilege we respect greatly.  We hope these instructions will help you stay on track for a fast and full recovery!     Increase activity slowly as tolerated    Complete by:  As directed            Follow-up Information  Follow up with Hessie Dibble, MD. Schedule an appointment as soon as possible for a visit in 2 weeks.   Specialty:  Orthopedic Surgery   Contact information:   [place] [address] [phone]       Follow up with [facility].   Why:  Someone from [facility] will contact you concerning start date and time for therapy.   Contact information:   [address] [place] [address] [phone]        Signed: Rich Fuchs 11/11/2014, 8:15 AM

## 2014-11-11 NOTE — Progress Notes (Signed)
Physical Therapy Treatment Patient Details Name: [name] MRN: [id number] DOB: April 17, 1946 Today's Date: 11/11/2014    History of Present Illness Patient is a 69 y/o male s/p L THA. PMH of HTN, HLD, ED.    PT Comments    Patient is progressing very well today. Able to walk with more ease and confidence. Able to complete stair training. His son will be there to assist him into the house. Patient safe to D/C from a mobility standpoint based on progression towards goals set on PT eval.    Follow Up Recommendations  Home health PT;Supervision/Assistance - 24 hour     Equipment Recommendations  Rolling walker with 5" wheels    Recommendations for Other Services       Precautions / Restrictions Precautions Precautions: None Precaution Comments: Direct anterior approach, no precautions. Restrictions LLE Weight Bearing: Weight bearing as tolerated    Mobility  Bed Mobility Overal bed mobility: Modified Independent             General bed mobility comments: with use of rails. Patient is getting bed delivered to his house today  Transfers Overall transfer level: Modified independent                  Ambulation/Gait Ambulation/Gait assistance: Supervision Ambulation Distance (Feet): 110 Feet Assistive device: Rolling walker (2 wheeled) Gait Pattern/deviations: Step-through pattern;Trunk flexed Gait velocity: increasing   General Gait Details: cues for upright posture   Stairs Stairs: Yes Stairs assistance: Min assist Stair Management: Backwards;With walker;No rails Number of Stairs: 2 General stair comments: Min A for RW management. Cues for sequence and technique  Wheelchair Mobility    Modified Rankin (Stroke Patients Only)       Balance                                    Cognition Arousal/Alertness: Awake/alert Behavior During Therapy: WFL for tasks assessed/performed Overall Cognitive Status: Within Functional Limits for  tasks assessed                      Exercises      General Comments        Pertinent Vitals/Pain Pain Score: 4  Pain Location: L hip Pain Descriptors / Indicators: Tightness Pain Intervention(s): Monitored during session;Repositioned    Home Living                      Prior Function            PT Goals (current goals can now be found in the care plan section) Progress towards PT goals: Progressing toward goals    Frequency  BID    PT Plan Current plan remains appropriate    Co-evaluation             End of Session   Activity Tolerance: Patient tolerated treatment well Patient left: in chair;with call bell/phone within reach     Time: 1004-1033 PT Time Calculation (min) (ACUTE ONLY): 29 min  Charges:  $Gait Training: 23-37 mins                    G Codes:      Jacqualyn Posey 11/11/2014, 10:49 AM 11/11/2014 Jacqualyn Posey PTA [phone] pager [phone] office

## 2014-11-11 NOTE — Progress Notes (Signed)
Patient was discharged home with family. Patient was given discharge instructions, prescriptions,and education. Patient was discharged with belongings. Patient was stable upon discharge.

## 2014-11-13 ENCOUNTER — Inpatient Hospital Stay (HOSPITAL_COMMUNITY)
Admission: EM | Admit: 2014-11-13 | Discharge: 2014-11-16 | DRG: 467 | Disposition: A | Discharge status: Skilled Nursing Facility | Payer: 59 | Attending: Orthopaedic Surgery | Admitting: Orthopaedic Surgery

## 2014-11-13 ENCOUNTER — Inpatient Hospital Stay (HOSPITAL_COMMUNITY): Payer: 59

## 2014-11-13 ENCOUNTER — Emergency Department (HOSPITAL_COMMUNITY): Payer: 59 | Admitting: Certified Registered"

## 2014-11-13 ENCOUNTER — Emergency Department (HOSPITAL_COMMUNITY): Payer: 59

## 2014-11-13 ENCOUNTER — Encounter (HOSPITAL_COMMUNITY)
Admission: EM | Disposition: A | Discharge status: Skilled Nursing Facility | Payer: Self-pay | Source: Home / Self Care | Attending: Orthopaedic Surgery

## 2014-11-13 ENCOUNTER — Encounter (HOSPITAL_COMMUNITY): Payer: Self-pay

## 2014-11-13 DIAGNOSIS — R52 Pain, unspecified: Secondary | ICD-10-CM

## 2014-11-13 DIAGNOSIS — M25552 Pain in left hip: Secondary | ICD-10-CM | POA: Diagnosis not present

## 2014-11-13 DIAGNOSIS — D62 Acute posthemorrhagic anemia: Secondary | ICD-10-CM | POA: Diagnosis not present

## 2014-11-13 DIAGNOSIS — T84021A Dislocation of internal left hip prosthesis, initial encounter: Secondary | ICD-10-CM | POA: Diagnosis present

## 2014-11-13 DIAGNOSIS — Z6841 Body Mass Index (BMI) 40.0 and over, adult: Secondary | ICD-10-CM | POA: Diagnosis not present

## 2014-11-13 DIAGNOSIS — T84091A Other mechanical complication of internal left hip prosthesis, initial encounter: Secondary | ICD-10-CM | POA: Diagnosis not present

## 2014-11-13 DIAGNOSIS — E785 Hyperlipidemia, unspecified: Secondary | ICD-10-CM | POA: Diagnosis present

## 2014-11-13 DIAGNOSIS — Z419 Encounter for procedure for purposes other than remedying health state, unspecified: Secondary | ICD-10-CM

## 2014-11-13 DIAGNOSIS — I10 Essential (primary) hypertension: Secondary | ICD-10-CM | POA: Diagnosis present

## 2014-11-13 DIAGNOSIS — T84098A Other mechanical complication of other internal joint prosthesis, initial encounter: Secondary | ICD-10-CM

## 2014-11-13 DIAGNOSIS — Z471 Aftercare following joint replacement surgery: Secondary | ICD-10-CM | POA: Diagnosis not present

## 2014-11-13 DIAGNOSIS — Z7982 Long term (current) use of aspirin: Secondary | ICD-10-CM

## 2014-11-13 DIAGNOSIS — Z87891 Personal history of nicotine dependence: Secondary | ICD-10-CM

## 2014-11-13 DIAGNOSIS — Z96649 Presence of unspecified artificial hip joint: Secondary | ICD-10-CM

## 2014-11-13 DIAGNOSIS — T84031A Mechanical loosening of internal left hip prosthetic joint, initial encounter: Principal | ICD-10-CM | POA: Diagnosis present

## 2014-11-13 DIAGNOSIS — G8918 Other acute postprocedural pain: Secondary | ICD-10-CM

## 2014-11-13 DIAGNOSIS — Z8781 Personal history of (healed) traumatic fracture: Secondary | ICD-10-CM

## 2014-11-13 DIAGNOSIS — Z9889 Other specified postprocedural states: Secondary | ICD-10-CM

## 2014-11-13 DIAGNOSIS — Z96642 Presence of left artificial hip joint: Secondary | ICD-10-CM | POA: Diagnosis not present

## 2014-11-13 HISTORY — PX: REVISION TOTAL HIP ARTHROPLASTY: SHX766

## 2014-11-13 HISTORY — PX: TOTAL HIP ARTHROPLASTY: SHX124

## 2014-11-13 LAB — I-STAT CHEM 8, ED
BUN: 16 mg/dL (ref 6–20)
CHLORIDE: 98 mmol/L — AB (ref 101–111)
Calcium, Ion: 1.18 mmol/L (ref 1.13–1.30)
Creatinine, Ser: 0.8 mg/dL (ref 0.61–1.24)
GLUCOSE: 129 mg/dL — AB (ref 65–99)
HEMATOCRIT: 36 % — AB (ref 39.0–52.0)
HEMOGLOBIN: 12.2 g/dL — AB (ref 13.0–17.0)
POTASSIUM: 4.5 mmol/L (ref 3.5–5.1)
Sodium: 137 mmol/L (ref 135–145)
TCO2: 26 mmol/L (ref 0–100)

## 2014-11-13 LAB — CBC WITH DIFFERENTIAL/PLATELET
BASOS PCT: 0 % (ref 0–1)
Basophils Absolute: 0 10*3/uL (ref 0.0–0.1)
EOS PCT: 4 % (ref 0–5)
Eosinophils Absolute: 0.3 10*3/uL (ref 0.0–0.7)
HEMATOCRIT: 34.4 % — AB (ref 39.0–52.0)
Hemoglobin: 11.5 g/dL — ABNORMAL LOW (ref 13.0–17.0)
LYMPHS ABS: 1 10*3/uL (ref 0.7–4.0)
Lymphocytes Relative: 13 % (ref 12–46)
MCH: 30.2 pg (ref 26.0–34.0)
MCHC: 33.4 g/dL (ref 30.0–36.0)
MCV: 90.3 fL (ref 78.0–100.0)
MONOS PCT: 11 % (ref 3–12)
Monocytes Absolute: 0.8 10*3/uL (ref 0.1–1.0)
NEUTROS ABS: 5.6 10*3/uL (ref 1.7–7.7)
Neutrophils Relative %: 72 % (ref 43–77)
PLATELETS: 214 10*3/uL (ref 150–400)
RBC: 3.81 MIL/uL — ABNORMAL LOW (ref 4.22–5.81)
RDW: 13.2 % (ref 11.5–15.5)
WBC: 7.8 10*3/uL (ref 4.0–10.5)

## 2014-11-13 LAB — PROTIME-INR
INR: 1.07 (ref 0.00–1.49)
Prothrombin Time: 14 seconds (ref 11.6–15.2)

## 2014-11-13 SURGERY — ARTHROPLASTY, HIP, TOTAL, ANTERIOR APPROACH
Anesthesia: Spinal | Site: Hip | Laterality: Left

## 2014-11-13 MED ORDER — HYDROMORPHONE HCL 1 MG/ML IJ SOLN: 0.2500 mg | INTRAMUSCULAR | Status: DC | PRN

## 2014-11-13 MED ORDER — LIDOCAINE HCL (CARDIAC) 20 MG/ML IV SOLN
INTRAVENOUS | Status: AC
  Filled 2014-11-13: qty 5

## 2014-11-13 MED ORDER — PHENYLEPHRINE HCL 10 MG/ML IJ SOLN
10.0000 mg | INTRAMUSCULAR | Status: DC | PRN
  Administered 2014-11-13: 20 ug/min via INTRAVENOUS

## 2014-11-13 MED ORDER — MENTHOL 3 MG MT LOZG
1.0000 | LOZENGE | OROMUCOSAL | Status: DC | PRN
Start: 2014-11-13 — End: 2014-11-16

## 2014-11-13 MED ORDER — PHENYLEPHRINE HCL 10 MG/ML IJ SOLN
INTRAMUSCULAR | Status: AC
  Filled 2014-11-13: qty 1

## 2014-11-13 MED ORDER — SIMVASTATIN 20 MG PO TABS
20.0000 mg | ORAL_TABLET | Freq: Every day | ORAL | Status: DC
  Administered 2014-11-14 – 2014-11-15 (×2): 20 mg via ORAL
  Filled 2014-11-13 (×2): qty 1

## 2014-11-13 MED ORDER — PHENOL 1.4 % MT LIQD: 1.0000 | OROMUCOSAL | Status: DC | PRN

## 2014-11-13 MED ORDER — MIDAZOLAM HCL 2 MG/2ML IJ SOLN
INTRAMUSCULAR | Status: AC
  Filled 2014-11-13: qty 2

## 2014-11-13 MED ORDER — CEFAZOLIN SODIUM-DEXTROSE 2-3 GM-% IV SOLR
INTRAVENOUS | Status: AC
  Filled 2014-11-13: qty 50

## 2014-11-13 MED ORDER — MEPERIDINE HCL 25 MG/ML IJ SOLN
6.2500 mg | INTRAMUSCULAR | Status: DC | PRN
  Administered 2014-11-13: 12.5 mg via INTRAVENOUS

## 2014-11-13 MED ORDER — PHENYLEPHRINE HCL 10 MG/ML IJ SOLN
INTRAMUSCULAR | Status: DC | PRN
  Administered 2014-11-13 (×2): 80 ug via INTRAVENOUS

## 2014-11-13 MED ORDER — PHENYLEPHRINE 40 MCG/ML (10ML) SYRINGE FOR IV PUSH (FOR BLOOD PRESSURE SUPPORT)
PREFILLED_SYRINGE | INTRAVENOUS | Status: AC
  Filled 2014-11-13: qty 10

## 2014-11-13 MED ORDER — METOCLOPRAMIDE HCL 5 MG PO TABS: 5.0000 mg | ORAL_TABLET | Freq: Three times a day (TID) | ORAL | Status: DC | PRN

## 2014-11-13 MED ORDER — ACETAMINOPHEN 325 MG PO TABS: 650.0000 mg | ORAL_TABLET | Freq: Four times a day (QID) | ORAL | Status: DC | PRN

## 2014-11-13 MED ORDER — LACTATED RINGERS IV SOLN
INTRAVENOUS | Status: DC | PRN
  Administered 2014-11-13: 17:00:00 via INTRAVENOUS

## 2014-11-13 MED ORDER — STERILE WATER FOR INJECTION IJ SOLN
INTRAMUSCULAR | Status: AC
  Filled 2014-11-13: qty 20

## 2014-11-13 MED ORDER — 0.9 % SODIUM CHLORIDE (POUR BTL) OPTIME
TOPICAL | Status: DC | PRN
  Administered 2014-11-13: 1000 mL

## 2014-11-13 MED ORDER — PROPOFOL 10 MG/ML IV BOLUS
INTRAVENOUS | Status: AC
  Filled 2014-11-13: qty 20

## 2014-11-13 MED ORDER — FENTANYL CITRATE (PF) 100 MCG/2ML IJ SOLN
INTRAMUSCULAR | Status: DC | PRN
  Administered 2014-11-13: 50 ug via INTRAVENOUS

## 2014-11-13 MED ORDER — SODIUM CHLORIDE 0.9 % IR SOLN
Status: DC | PRN
  Administered 2014-11-13: 3000 mL

## 2014-11-13 MED ORDER — ALUM & MAG HYDROXIDE-SIMETH 200-200-20 MG/5ML PO SUSP
30.0000 mL | ORAL | Status: DC | PRN
Start: 2014-11-13 — End: 2014-11-16

## 2014-11-13 MED ORDER — ONDANSETRON HCL 4 MG/2ML IJ SOLN: 4.0000 mg | Freq: Four times a day (QID) | INTRAMUSCULAR | Status: DC | PRN

## 2014-11-13 MED ORDER — DIPHENHYDRAMINE HCL 12.5 MG/5ML PO ELIX: 12.5000 mg | ORAL_SOLUTION | ORAL | Status: DC | PRN

## 2014-11-13 MED ORDER — CEFAZOLIN SODIUM-DEXTROSE 2-3 GM-% IV SOLR
INTRAVENOUS | Status: DC | PRN
  Administered 2014-11-13: 2 g via INTRAVENOUS

## 2014-11-13 MED ORDER — HYDROMORPHONE HCL 1 MG/ML IJ SOLN
0.5000 mg | INTRAMUSCULAR | Status: DC | PRN
  Administered 2014-11-13 – 2014-11-14 (×4): 1 mg via INTRAVENOUS
  Filled 2014-11-13 (×4): qty 1

## 2014-11-13 MED ORDER — BISACODYL 5 MG PO TBEC: 5.0000 mg | DELAYED_RELEASE_TABLET | Freq: Every day | ORAL | Status: DC | PRN

## 2014-11-13 MED ORDER — METHOCARBAMOL 1000 MG/10ML IJ SOLN
500.0000 mg | Freq: Four times a day (QID) | INTRAMUSCULAR | Status: DC | PRN
  Administered 2014-11-13: 500 mg via INTRAVENOUS
  Filled 2014-11-13 (×2): qty 5

## 2014-11-13 MED ORDER — SODIUM CHLORIDE 0.9 % IV SOLN
INTRAVENOUS | Status: DC
  Administered 2014-11-13: 14:00:00 via INTRAVENOUS

## 2014-11-13 MED ORDER — CEFAZOLIN SODIUM-DEXTROSE 2-3 GM-% IV SOLR
2.0000 g | Freq: Four times a day (QID) | INTRAVENOUS | Status: AC
  Administered 2014-11-13 – 2014-11-14 (×2): 2 g via INTRAVENOUS
  Filled 2014-11-13 (×3): qty 50

## 2014-11-13 MED ORDER — TRANEXAMIC ACID 1000 MG/10ML IV SOLN
1000.0000 mg | INTRAVENOUS | Status: AC
  Administered 2014-11-13: 1000 mg via INTRAVENOUS
  Filled 2014-11-13: qty 10

## 2014-11-13 MED ORDER — LACTATED RINGERS IV SOLN
INTRAVENOUS | Status: DC
  Administered 2014-11-13: 75 mL/h via INTRAVENOUS

## 2014-11-13 MED ORDER — MEPERIDINE HCL 25 MG/ML IJ SOLN
INTRAMUSCULAR | Status: AC
  Filled 2014-11-13: qty 1

## 2014-11-13 MED ORDER — LISINOPRIL 10 MG PO TABS
10.0000 mg | ORAL_TABLET | Freq: Every day | ORAL | Status: DC
  Administered 2014-11-13 – 2014-11-16 (×4): 10 mg via ORAL
  Filled 2014-11-13 (×4): qty 1

## 2014-11-13 MED ORDER — METOCLOPRAMIDE HCL 5 MG/ML IJ SOLN: 5.0000 mg | Freq: Three times a day (TID) | INTRAMUSCULAR | Status: DC | PRN

## 2014-11-13 MED ORDER — PROPOFOL INFUSION 10 MG/ML OPTIME
INTRAVENOUS | Status: DC | PRN
  Administered 2014-11-13: 50 ug/kg/min via INTRAVENOUS

## 2014-11-13 MED ORDER — MIDAZOLAM HCL 5 MG/5ML IJ SOLN
INTRAMUSCULAR | Status: DC | PRN
  Administered 2014-11-13: 2 mg via INTRAVENOUS

## 2014-11-13 MED ORDER — ACETAMINOPHEN 650 MG RE SUPP: 650.0000 mg | Freq: Four times a day (QID) | RECTAL | Status: DC | PRN

## 2014-11-13 MED ORDER — FENTANYL CITRATE (PF) 250 MCG/5ML IJ SOLN
INTRAMUSCULAR | Status: AC
  Filled 2014-11-13: qty 5

## 2014-11-13 MED ORDER — LIDOCAINE HCL (CARDIAC) 20 MG/ML IV SOLN
INTRAVENOUS | Status: DC | PRN
  Administered 2014-11-13: 60 mg via INTRAVENOUS

## 2014-11-13 MED ORDER — METHOCARBAMOL 500 MG PO TABS
500.0000 mg | ORAL_TABLET | Freq: Four times a day (QID) | ORAL | Status: DC | PRN
  Administered 2014-11-14 – 2014-11-16 (×5): 500 mg via ORAL
  Filled 2014-11-13 (×6): qty 1

## 2014-11-13 MED ORDER — DOCUSATE SODIUM 100 MG PO CAPS
100.0000 mg | ORAL_CAPSULE | Freq: Two times a day (BID) | ORAL | Status: DC
  Administered 2014-11-13 – 2014-11-16 (×6): 100 mg via ORAL
  Filled 2014-11-13 (×6): qty 1

## 2014-11-13 MED ORDER — ONDANSETRON HCL 4 MG PO TABS: 4.0000 mg | ORAL_TABLET | Freq: Four times a day (QID) | ORAL | Status: DC | PRN

## 2014-11-13 MED ORDER — HYDROCODONE-ACETAMINOPHEN 7.5-325 MG PO TABS
1.0000 | ORAL_TABLET | ORAL | Status: DC | PRN
  Administered 2014-11-13 – 2014-11-16 (×12): 2 via ORAL
  Filled 2014-11-13 (×12): qty 2

## 2014-11-13 MED ORDER — PROMETHAZINE HCL 25 MG/ML IJ SOLN: 6.2500 mg | INTRAMUSCULAR | Status: DC | PRN

## 2014-11-13 MED ORDER — ROCURONIUM BROMIDE 50 MG/5ML IV SOLN
INTRAVENOUS | Status: AC
  Filled 2014-11-13: qty 1

## 2014-11-13 SURGICAL SUPPLY — 60 items
BLADE SAW SGTL 18X1.27X75 (BLADE) ×2 IMPLANT
BLADE SAW SGTL 18X1.27X75MM (BLADE) ×1
BLADE SURG ROTATE 9660 (MISCELLANEOUS) IMPLANT
BONE CANC CHIPS 40CC CAN1/2 (Bone Implant) ×3 IMPLANT
CELLS DAT CNTRL 66122 CELL SVR (MISCELLANEOUS) ×1 IMPLANT
CHIPS CANC BONE 40CC CAN1/2 (Bone Implant) ×1 IMPLANT
COVER PERINEAL POST (MISCELLANEOUS) ×3 IMPLANT
COVER SURGICAL LIGHT HANDLE (MISCELLANEOUS) ×3 IMPLANT
CUP SECTOR GRIPTON 58MM (Orthopedic Implant) ×2 IMPLANT
DRAPE C-ARM 42X72 X-RAY (DRAPES) ×3 IMPLANT
DRAPE IMP U-DRAPE 54X76 (DRAPES) ×3 IMPLANT
DRAPE STERI IOBAN 125X83 (DRAPES) ×3 IMPLANT
DRAPE U-SHAPE 47X51 STRL (DRAPES) ×9 IMPLANT
DRSG AQUACEL AG ADV 3.5X10 (GAUZE/BANDAGES/DRESSINGS) ×3 IMPLANT
DRSG AQUACEL AG ADV 3.5X14 (GAUZE/BANDAGES/DRESSINGS) ×2 IMPLANT
DURAPREP 26ML APPLICATOR (WOUND CARE) ×3 IMPLANT
ELECT BLADE 4.0 EZ CLEAN MEGAD (MISCELLANEOUS) ×3
ELECT CAUTERY BLADE 6.4 (BLADE) ×3 IMPLANT
ELECT REM PT RETURN 9FT ADLT (ELECTROSURGICAL) ×3
ELECTRODE BLDE 4.0 EZ CLN MEGD (MISCELLANEOUS) IMPLANT
ELECTRODE REM PT RTRN 9FT ADLT (ELECTROSURGICAL) ×1 IMPLANT
FACESHIELD WRAPAROUND (MASK) ×6 IMPLANT
FACESHIELD WRAPAROUND OR TEAM (MASK) ×2 IMPLANT
GLOVE BIO SURGEON STRL SZ8 (GLOVE) ×15 IMPLANT
GLOVE BIOGEL PI IND STRL 8 (GLOVE) ×2 IMPLANT
GLOVE BIOGEL PI INDICATOR 8 (GLOVE) ×4
GOWN STRL REUS W/ TWL LRG LVL3 (GOWN DISPOSABLE) ×1 IMPLANT
GOWN STRL REUS W/ TWL XL LVL3 (GOWN DISPOSABLE) ×2 IMPLANT
GOWN STRL REUS W/TWL LRG LVL3 (GOWN DISPOSABLE) ×3
GOWN STRL REUS W/TWL XL LVL3 (GOWN DISPOSABLE) ×6
GRAFT BNE CHIP CANC 1-8 40 (Bone Implant) IMPLANT
HANDPIECE INTERPULSE COAX TIP (DISPOSABLE) ×3
KIT BASIN OR (CUSTOM PROCEDURE TRAY) ×3 IMPLANT
KIT ROOM TURNOVER OR (KITS) ×3 IMPLANT
LINER BOOT UNIVERSAL DISP (MISCELLANEOUS) ×3 IMPLANT
MANIFOLD NEPTUNE II (INSTRUMENTS) ×3 IMPLANT
NS IRRIG 1000ML POUR BTL (IV SOLUTION) ×3 IMPLANT
PACK TOTAL JOINT (CUSTOM PROCEDURE TRAY) ×3 IMPLANT
PACK UNIVERSAL I (CUSTOM PROCEDURE TRAY) ×3 IMPLANT
PAD ARMBOARD 7.5X6 YLW CONV (MISCELLANEOUS) ×6 IMPLANT
RETRACTOR WND ALEXIS 18 MED (MISCELLANEOUS) ×1 IMPLANT
RTRCTR WOUND ALEXIS 18CM MED (MISCELLANEOUS) ×3
SCREW 6.5MMX25MM (Screw) ×2 IMPLANT
SCREW PINN CAN 6.5X20 (Screw) ×4 IMPLANT
SET HNDPC FAN SPRY TIP SCT (DISPOSABLE) IMPLANT
STAPLER VISISTAT (STAPLE) ×2 IMPLANT
STAPLER VISISTAT 35W (STAPLE) ×3 IMPLANT
SUT ETHIBOND NAB CT1 #1 30IN (SUTURE) ×7 IMPLANT
SUT VIC AB 0 CT1 27 (SUTURE)
SUT VIC AB 0 CT1 27XBRD ANBCTR (SUTURE) IMPLANT
SUT VIC AB 1 CT1 27 (SUTURE) ×3
SUT VIC AB 1 CT1 27XBRD ANBCTR (SUTURE) ×1 IMPLANT
SUT VIC AB 2-0 CT1 27 (SUTURE) ×3
SUT VIC AB 2-0 CT1 TAPERPNT 27 (SUTURE) ×1 IMPLANT
SUT VLOC 180 0 24IN GS25 (SUTURE) ×5 IMPLANT
TIP HIGH FLOW IRRIGATION COAX (MISCELLANEOUS) ×2 IMPLANT
TOWEL OR 17X24 6PK STRL BLUE (TOWEL DISPOSABLE) ×3 IMPLANT
TOWEL OR 17X26 10 PK STRL BLUE (TOWEL DISPOSABLE) ×6 IMPLANT
TRAY FOLEY CATH 14FR (SET/KITS/TRAYS/PACK) IMPLANT
WATER STERILE IRR 1000ML POUR (IV SOLUTION) ×6 IMPLANT

## 2014-11-13 NOTE — Anesthesia Preprocedure Evaluation (Addendum)
Anesthesia Evaluation  Patient identified by MRN, date of birth, ID band Patient awake    Reviewed: Allergy & Precautions, NPO status , Patient's Chart, lab work & pertinent test results  Airway Mallampati: II  TM Distance: <3 FB Neck ROM: Full    Dental no notable dental hx.    Pulmonary neg pulmonary ROS, former smoker,  breath sounds clear to auscultation  Pulmonary exam normal       Cardiovascular hypertension, Normal cardiovascular examRhythm:Regular Rate:Normal     Neuro/Psych negative neurological ROS  negative psych ROS   GI/Hepatic negative GI ROS, Neg liver ROS,   Endo/Other  Morbid obesity  Renal/GU negative Renal ROS  negative genitourinary   Musculoskeletal negative musculoskeletal ROS (+)   Abdominal   Peds negative pediatric ROS (+)  Hematology negative hematology ROS (+)   Anesthesia Other Findings   Reproductive/Obstetrics negative OB ROS                             Anesthesia Physical  Anesthesia Plan  ASA: III  Anesthesia Plan: Spinal   Post-op Pain Management:    Induction: Intravenous  Airway Management Planned: Simple Face Mask  Additional Equipment:   Intra-op Plan:   Post-operative Plan:   Informed Consent: I have reviewed the patients History and Physical, chart, labs and discussed the procedure including the risks, benefits and alternatives for the proposed anesthesia with the patient or authorized representative who has indicated his/her understanding and acceptance.   Dental advisory given  Plan Discussed with: CRNA  Anesthesia Plan Comments:         Anesthesia Quick Evaluation

## 2014-11-13 NOTE — ED Provider Notes (Signed)
CSN: [id number]     Arrival date & time 11/13/14  1120 History   First MD Initiated Contact with Patient 11/13/14 1137     Chief Complaint  Patient presents with  . Hip Pain     HPI Pt was seen at 1150. Per pt, c/o sudden onset and persistence of constant left hip "pain" that began this morning. Pt states he was walking with his walker at home when he "felt a pop" in his left hip approximately 0415, followed by "swelling." Pt states he has been unable to bear weight or "move it like I was" after this incident. Pt describes ROM of his left hip as "rice crispies." Pt denies falling. Denies focal motor weakness, no tingling/numbness in extremities, no fevers.    Past Medical History  Diagnosis Date  . Hyperlipidemia     takes Simvastatin daily  . Osteoarthritis     knee  . ED (erectile dysfunction)     takes Viagra as needed  . Hypertension     takes Lisinopril daily  . Joint pain   . History of bronchitis last time about 41yrs ago   Past Surgical History  Procedure Laterality Date  . Achiles tendon Left   . Shoulder surgery Right   . Knee arthroscopy Left   . Finger surgery      left pointer   . Cholecystectomy      with mesh  . Vein ligation and stripping Left at age 85  . Wrist surgery Left at age 43  . Wisdom teeth extracted  1972  . Colonoscopy    . Steroid injection to scar      about 6 months ago  . Total hip arthroplasty Left 11/08/2014    Procedure: TOTAL HIP ARTHROPLASTY ANTERIOR APPROACH;  Surgeon: Melrose Nakayama, MD;  Location: [place];  Service: Orthopedics;  Laterality: Left;    History  Substance Use Topics  . Smoking status: Former Research scientist (life sciences)  . Smokeless tobacco: Not on file     Comment: quit smoking in 1993  . Alcohol Use: Yes     Comment: rarely    Review of Systems ROS: Statement: All systems negative except as marked or noted in the HPI; Constitutional: Negative for fever and chills. ; ; Eyes: Negative for eye pain, redness and discharge. ; ; ENMT:  Negative for ear pain, hoarseness, nasal congestion, sinus pressure and sore throat. ; ; Cardiovascular: Negative for chest pain, palpitations, diaphoresis, dyspnea and peripheral edema. ; ; Respiratory: Negative for cough, wheezing and stridor. ; ; Gastrointestinal: Negative for nausea, vomiting, diarrhea, abdominal pain, blood in stool, hematemesis, jaundice and rectal bleeding. . ; ; Genitourinary: Negative for dysuria, flank pain and hematuria. ; ; Musculoskeletal: +left hip pain. Negative for back pain and neck pain. Negative for deformity and trauma.; ; Skin: Negative for pruritus, rash, abrasions, blisters, bruising and skin lesion.; ; Neuro: Negative for headache, lightheadedness and neck stiffness. Negative for weakness, altered level of consciousness , altered mental status, extremity weakness, paresthesias, involuntary movement, seizure and syncope.      Allergies  Review of patient's allergies indicates no known allergies.  Home Medications   Prior to Admission medications   Medication Sig Start Date End Date Taking? Authorizing Provider  Ascorbic Acid (VITAMIN C PO) Take 1 tablet by mouth daily.    Historical Provider, MD  aspirin EC 325 MG EC tablet Take 1 tablet (325 mg total) by mouth 2 (two) times daily after a meal. 11/11/14   Loni Dolly,  PA-C  Cholecalciferol (VITAMIN D-3 PO) Take 1 tablet by mouth daily.     Historical Provider, MD  cyanocobalamin 1000 MCG tablet Take 100 mcg by mouth daily.    Historical Provider, MD  HYDROcodone-acetaminophen (NORCO/VICODIN) 5-325 MG per tablet Take 1-2 tablets by mouth every 4 (four) hours as needed (breakthrough pain). 11/11/14   Loni Dolly, PA-C  lisinopril (PRINIVIL,ZESTRIL) 10 MG tablet Take 10 mg by mouth daily.    Historical Provider, MD  methocarbamol (ROBAXIN) 500 MG tablet Take 1 tablet (500 mg total) by mouth every 6 (six) hours as needed for muscle spasms. 11/11/14   Loni Dolly, PA-C  Multiple Vitamin (MULTI VITAMIN DAILY PO) Take  1 tablet by mouth daily.     Historical Provider, MD  Omega-3 Fatty Acids (FISH OIL) 500 MG CAPS Take 500 mg by mouth daily.     Historical Provider, MD  simvastatin (ZOCOR) 20 MG tablet Take 20 mg by mouth daily at 6 PM.     Historical Provider, MD  VIAGRA 100 MG tablet Take 100 mg by mouth daily as needed for erectile dysfunction.  09/08/14   Historical Provider, MD  VITAMIN E PO Take 1 tablet by mouth daily.    Historical Provider, MD   BP 108/53 mmHg  Pulse 77  Temp(Src) 99 F (37.2 C) (Oral)  Resp 14  SpO2 86% Physical Exam  1155: Physical examination:  Nursing notes reviewed; Vital signs and O2 SAT reviewed;  Constitutional: Well developed, Well nourished, Well hydrated, In no acute distress; Head:  Normocephalic, atraumatic; Eyes: EOMI, PERRL, No scleral icterus; ENMT: Mouth and pharynx normal, Mucous membranes moist; Neck: Supple, Full range of motion, No lymphadenopathy; Cardiovascular: Regular rate and rhythm, No gallop; Respiratory: Breath sounds clear & equal bilaterally, No wheezes.  Speaking full sentences with ease, Normal respiratory effort/excursion; Chest: Nontender, Movement normal; Abdomen: Soft, Nontender, Nondistended, Normal bowel sounds; Genitourinary: No CVA tenderness; Extremities: Pulses normal, Decreased ROM left hip due to pain. +left hip tenderness to palp with mild localized edema. NT left knee/ankle/foot. DSD left lateral hip in place. No obvious deformity. NMS intact left foot with strong pedal pulse..; Neuro: AA&Ox3, Major CN grossly intact.  Speech clear. +left hip decreased ROM due to pain, otherwise no gross focal motor deficits in extremities.; Skin: Color normal, Warm, Dry.   ED Course  Procedures    1255:  T/C to Ortho Dr. Rhona Raider, case discussed, including:  HPI, pertinent PM/SHx, VS/PE, dx testing, ED course and treatment:  Requests to keep pt NPO, he ordered CT hip, he is on his way in to see pt and likely go to OR for repair. Pt and family updated  regarding plan of care.   1310:  Ortho has come to the ED for evaluation: will take to OR after CT completed.       MDM  MDM Reviewed: previous chart, nursing note and vitals Reviewed previous: labs Interpretation: labs, x-ray and CT scan    Dg Hip Unilat With Pelvis 2-3 Views Left 11/13/2014   CLINICAL DATA:  Left hip replacement performed on Tuesday. Acute pain today.  EXAM: LEFT HIP (WITH PELVIS) 2-3 VIEWS  COMPARISON:  11/08/2014 postoperative study  FINDINGS: Left total hip arthroplasty identified.  There has been interval migration/protrusio of the acetabular component superiorly and medially with rotation anteriorly.  The femoral head component appears seated.  No other acute abnormalities are identified.  IMPRESSION: Interval migration/protrusio and rotation of the acetabular component of left hip replacement. Femoral head component appears  seated.   Electronically Signed   By: Margarette Canada M.D.   On: 11/13/2014 12:29    Results for orders placed or performed during the hospital encounter of 11/13/14  CBC with Differential  Result Value Ref Range   WBC 7.8 4.0 - 10.5 K/uL   RBC 3.81 (L) 4.22 - 5.81 MIL/uL   Hemoglobin 11.5 (L) 13.0 - 17.0 g/dL   HCT 34.4 (L) 39.0 - 52.0 %   MCV 90.3 78.0 - 100.0 fL   MCH 30.2 26.0 - 34.0 pg   MCHC 33.4 30.0 - 36.0 g/dL   RDW 13.2 11.5 - 15.5 %   Platelets 214 150 - 400 K/uL   Neutrophils Relative % 72 43 - 77 %   Neutro Abs 5.6 1.7 - 7.7 K/uL   Lymphocytes Relative 13 12 - 46 %   Lymphs Abs 1.0 0.7 - 4.0 K/uL   Monocytes Relative 11 3 - 12 %   Monocytes Absolute 0.8 0.1 - 1.0 K/uL   Eosinophils Relative 4 0 - 5 %   Eosinophils Absolute 0.3 0.0 - 0.7 K/uL   Basophils Relative 0 0 - 1 %   Basophils Absolute 0.0 0.0 - 0.1 K/uL  Protime-INR  Result Value Ref Range   Prothrombin Time 14.0 11.6 - 15.2 seconds   INR 1.07 0.00 - 1.49  I-stat chem 8, ed  Result Value Ref Range   Sodium 137 135 - 145 mmol/L   Potassium 4.5 3.5 - 5.1  mmol/L   Chloride 98 (L) 101 - 111 mmol/L   BUN 16 6 - 20 mg/dL   Creatinine, Ser 0.80 0.61 - 1.24 mg/dL   Glucose, Bld 129 (H) 65 - 99 mg/dL   Calcium, Ion 1.18 1.13 - 1.30 mmol/L   TCO2 26 0 - 100 mmol/L   Hemoglobin 12.2 (L) 13.0 - 17.0 g/dL   HCT 36.0 (L) 39.0 - 52.0 %    Ct Hip Left Wo Contrast 11/13/2014   CLINICAL DATA:  Acute onset of left hip pain this morning. Total hip replacement on 11/08/2014.  EXAM: CT OF THE LEFT HIP WITHOUT CONTRAST  TECHNIQUE: Multidetector CT imaging of the left hip was performed according to the standard protocol. Multiplanar CT image reconstructions were also generated.  COMPARISON:  Images dated 11/13/2014 and 11/08/2014  FINDINGS: The acetabular shell of the left total hip prosthesis has migrated through the medial wall of the right acetabulum and is now rotated into different tilt and anteversion. There is a small amount of air in hemorrhage in around the left obturator internus muscle extends along the left side of the bladder. The medial wall of the acetabulum as disrupted. The femoral component is properly seated within the acetabular shell. The femoral stem appears normal.  IMPRESSION: Protrusion of the acetabular component of the left total hip prosthesis through the medial wall of the left acetabulum with slight hemorrhage in the left side of the pelvis. Acetabular component has changed tilt and anteversion.   Electronically Signed   By: Lorriane Shire M.D.   On: 11/13/2014 14:35            Francine Graven, DO 11/16/14 1132

## 2014-11-13 NOTE — Interval H&P Note (Signed)
OK for surgery PD 

## 2014-11-13 NOTE — ED Notes (Signed)
Patient returned from X-ray 

## 2014-11-13 NOTE — ED Notes (Signed)
To room via EMS. Pt had left total hip replacement 11-08-14.  Pt was walking from bathroom back to bedroom around 4:15am and felt a "pop", was unable to bear full weight on left leg and left upper leg to knee has swelling noted.  Pt called Dr. Briscoe Burns who advised pt come to ED for xray.  EMS gave pt Fentanyl 150 mcg and Zofran 4mg .  No nausea now.  Pain 3/10.

## 2014-11-13 NOTE — Anesthesia Postprocedure Evaluation (Signed)
Anesthesia Post Note  Patient: [name]  Procedure(s) Performed: Procedure(s) (LRB): REVISION OF LEFT HIP ARTHROPLASTY ANTERIOR APPROACH (Left)  Anesthesia type: Spinal  Patient location: PACU  Post pain: Pain level controlled  Post assessment: Post-op Vital signs reviewed  Last Vitals: BP 112/62 mmHg  Pulse 79  Temp(Src) 36.7 C (Oral)  Resp 16  SpO2 98%  Post vital signs: Reviewed  Level of consciousness: sedated  Complications: No apparent anesthesia complications

## 2014-11-13 NOTE — Anesthesia Procedure Notes (Addendum)
Procedure Name: MAC Date/Time: 11/13/2014 3:40 PM Performed by: Julian Reil Pre-anesthesia Checklist: Patient identified, Suction available, Emergency Drugs available and Patient being monitored Patient Re-evaluated:Patient Re-evaluated prior to inductionOxygen Delivery Method: Simple face mask Placement Confirmation: positive ETCO2 and breath sounds checked- equal and bilateral   Spinal Patient location during procedure: OR Staffing Performed by: anesthesiologist  Preanesthetic Checklist Completed: patient identified, site marked, surgical consent, pre-op evaluation, timeout performed, IV checked, risks and benefits discussed and monitors and equipment checked Spinal Block Patient position: sitting Prep: Betadine Patient monitoring: heart rate, continuous pulse ox and blood pressure Approach: right paramedian Location: L3-4 Injection technique: single-shot Needle Needle type: Sprotte  Needle gauge: 24 G Needle length: 9 cm Assessment Sensory level: T8 Additional Notes Expiration date of kit checked and confirmed. Patient tolerated procedure well, without complications.

## 2014-11-13 NOTE — ED Notes (Signed)
OR called to bring pt to room 36

## 2014-11-13 NOTE — H&P (Signed)
Melrose Nakayama, MD  Chauncey Cruel, PA-C  Loni Dolly, PA-C                                  [facility]                [address], [place], [place]  [address]   [facility] [place]            MRN:  [id number] DOB/SEX:  06-27-1946/male     CHIEF COMPLAINT:  Painful left hip  HISTORY: Rama Mcclintock Fedoris a 69 y.o. male with history of anterior THR a week ago.  Was walking at home over weekend WBAT with no pain but then early this morning while up felt a crack in his hip and had increased pain.  Was taken to ED via ambulance.  Xrays show change in position of acetabular component and CT is scheduled.     PAST MEDICAL HISTORY: Patient Active Problem List   Diagnosis Date Noted  . Primary osteoarthritis of left hip 11/08/2014   Past Medical History  Diagnosis Date  . Hyperlipidemia     takes Simvastatin daily  . Osteoarthritis     knee  . ED (erectile dysfunction)     takes Viagra as needed  . Hypertension     takes Lisinopril daily  . Joint pain   . History of bronchitis last time about 57yrs ago   Past Surgical History  Procedure Laterality Date  . Achiles tendon Left   . Shoulder surgery Right   . Knee arthroscopy Left   . Finger surgery      left pointer   . Cholecystectomy      with mesh  . Vein ligation and stripping Left at age 3  . Wrist surgery Left at age 62  . Wisdom teeth extracted  1972  . Colonoscopy    . Steroid injection to scar      about 6 months ago  . Total hip arthroplasty Left 11/08/2014    Procedure: TOTAL HIP ARTHROPLASTY ANTERIOR APPROACH;  Surgeon: Melrose Nakayama, MD;  Location: [place] [facility];  Service: Orthopedics;  Laterality: Left;     MEDICATIONS:   Current facility-administered medications:  .  0.9 %  sodium chloride infusion, , Intravenous, Continuous, Francine Graven, DO  Current outpatient prescriptions:  .  Ascorbic Acid (VITAMIN C PO), Take 1 tablet by mouth daily., Disp: , Rfl:  .  aspirin EC 325 MG EC  tablet, Take 1 tablet (325 mg total) by mouth 2 (two) times daily after a meal., Disp: 60 tablet, Rfl: 0 .  Cholecalciferol (VITAMIN D-3 PO), Take 1 tablet by mouth daily. , Disp: , Rfl:  .  cyanocobalamin 1000 MCG tablet, Take 100 mcg by mouth daily., Disp: , Rfl:  .  HYDROcodone-acetaminophen (NORCO/VICODIN) 5-325 MG per tablet, Take 1-2 tablets by mouth every 4 (four) hours as needed (breakthrough pain)., Disp: 50 tablet, Rfl: 0 .  lisinopril (PRINIVIL,ZESTRIL) 10 MG tablet, Take 10 mg by mouth daily., Disp: , Rfl:  .  methocarbamol (ROBAXIN) 500 MG tablet, Take 1 tablet (500 mg total) by mouth every 6 (six) hours as needed for muscle spasms., Disp: 50 tablet, Rfl: 0 .  Multiple Vitamin (MULTI VITAMIN DAILY PO), Take 1 tablet by mouth daily. , Disp: , Rfl:  .  Omega-3 Fatty Acids (FISH OIL) 500 MG CAPS, Take 500 mg by mouth daily. , Disp: ,  Rfl:  .  simvastatin (ZOCOR) 20 MG tablet, Take 20 mg by mouth daily at 6 PM. , Disp: , Rfl:  .  VIAGRA 100 MG tablet, Take 100 mg by mouth daily as needed for erectile dysfunction. , Disp: , Rfl: 0 .  VITAMIN E PO, Take 1 tablet by mouth daily., Disp: , Rfl:   ALLERGIES:  No Known Allergies  REVIEW OF SYSTEMS: REVIEWED IN DETAIL IN CHART  FAMILY HISTORY:  History reviewed. No pertinent family history.  SOCIAL HISTORY:   History  Substance Use Topics  . Smoking status: Former Research scientist (life sciences)  . Smokeless tobacco: Not on file     Comment: quit smoking in 1993  . Alcohol Use: Yes     Comment: rarely     EXAMINATION: Vital signs in last 24 hours: Temp:  [99 F (37.2 C)] 99 F (37.2 C) (05/15 1140) Pulse Rate:  [77-85] 80 (05/15 1230) Resp:  [14-22] 15 (05/15 1230) BP: (108-115)/(53-68) 111/67 mmHg (05/15 1230) SpO2:  [86 %-99 %] 93 % (05/15 1230)  BP 111/67 mmHg  Pulse 80  Temp(Src) 99 F (37.2 C) (Oral)  Resp 15  SpO2 93%  General Appearance:    Alert, cooperative, no distress, appears stated age  Head:    Normocephalic, without obvious  abnormality, atraumatic  Eyes:    PERRL, conjunctiva/corneas clear, EOM's intact, fundi    benign, both eyes       Ears:    Normal TM's and external ear canals, both ears  Nose:   Nares normal, septum midline, mucosa normal, no drainage    or sinus tenderness  Throat:   Lips, mucosa, and tongue normal; teeth and gums normal  Neck:   Supple, symmetrical, trachea midline, no adenopathy;       thyroid:  No enlargement/tenderness/nodules; no carotid   bruit or JVD  Back:     Symmetric, no curvature, ROM normal, no CVA tenderness  Lungs:     Clear to auscultation bilaterally, respirations unlabored  Chest wall:    No tenderness or deformity  Heart:    Regular rate and rhythm, S1 and S2 normal, no murmur, rub   or gallop  Abdomen:     Soft, non-tender, bowel sounds active all four quadrants,    no masses, no organomegaly  Genitalia:    Rectal:    Extremities:   Three extremities normal, atraumatic, no cyanosis or edema  Pulses:   2+ and symmetric all extremities  Skin:   Skin color, texture, turgor normal, no rashes or lesions  Lymph nodes:   Cervical, supraclavicular, and axillary nodes normal  Neurologic:   CNII-XII intact. Normal strength, sensation and reflexes      throughout    Musculoskeletal Exam:   Left leg tender to motion.  Moderate swelling. CMS ok   DIAGNOSTIC STUDIES: Recent laboratory studies:  Recent Labs  11/09/14 0528 11/10/14 0507 11/11/14 0318  WBC 9.0 8.7 8.8  HGB 12.3* 11.5* 11.4*  HCT 35.4* 34.1* 33.9*  PLT 149* 131* 135*    Recent Labs  11/09/14 0528  NA 133*  K 4.2  CL 101  CO2 23  BUN 14  CREATININE 0.71  GLUCOSE 121*  CALCIUM 8.7*   Lab Results  Component Value Date   INR 1.04 10/27/2014     Recent Radiographic Studies :  Dg Chest 2 View  10/27/2014   CLINICAL DATA:  Total hip arthroplasty  EXAM: CHEST  2 VIEW  COMPARISON:  None.  FINDINGS: Mild cardiomegaly. Low  volumes. Normal vascularity. Left basilar nodular density. No effusion or  pneumothorax. There is pleural thickening along the mid lateral hemithoraces that is nonspecific and likely chronic.  IMPRESSION: Left basilar nodular density.  CT chest is recommended.   Electronically Signed   By: Marybelle Killings M.D.   On: 10/27/2014 10:53   Ct Chest W Contrast  11/09/2014   CLINICAL DATA:  Abnormal chest Xray. Pt was a smoker but quit back in 1993. Questionable left lower lung zone nodule on the recent prior chest radiograph.  EXAM: CT CHEST WITH CONTRAST  TECHNIQUE: Multidetector CT imaging of the chest was performed during intravenous contrast administration.  CONTRAST:  16mL OMNIPAQUE IOHEXOL 300 MG/ML  SOLN  COMPARISON:  10/27/2014  FINDINGS: Thoracic inlet:  Normal.  Mediastinum and hila: Heart normal in size and configuration. No coronary artery calcifications. Great vessels are normal in caliber. No atherosclerotic plaque. No mediastinal or hilar masses or pathologically enlarged lymph nodes.  Lungs and pleura: No lung mass or suspicious nodule. Small calcified granuloma in the right middle lobe. Minimal dependent subsegmental atelectasis. No lung consolidation or edema. No pleural effusion. The subtle opacity noted along the left heart border on the prior chest radiograph was likely due to prominent mediastinal fat. There is significant fat surrounding the heart and mediastinal structures as well as some more prominent subpleural fat along both hemithoraces.  Limited upper abdomen: Status post cholecystectomy. Hepatic steatosis. Otherwise unremarkable.  Musculoskeletal: Disc degenerative changes, relatively mild, throughout the visualized spine. No osteoblastic or osteolytic lesions  IMPRESSION: 1. No evidence of a lung mass or suspicious nodule. 2. No acute findings. 3. Small right middle lobe calcified granuloma. Hepatic steatosis. Status post cholecystectomy. Mild degenerative changes throughout the thoracic spine. No other abnormalities.   Electronically Signed   By: Lajean Manes  M.D.   On: 11/09/2014 09:29   Dg Hip Operative Unilat With Pelvis Left  11/08/2014   CLINICAL DATA:  Left total hip arthroplasty.  EXAM: OPERATIVE left HIP (WITH PELVIS IF PERFORMED) 1 VIEWS  TECHNIQUE: Fluoroscopic spot image(s) were submitted for interpretation post-operatively.  FLUOROSCOPY TIME:  Radiation Exposure Index (as provided by the fluoroscopic device):  If the device does not provide the exposure index:  Fluoroscopy Time:  50 second  Number of Acquired Images:  1  COMPARISON:  None.  FINDINGS: The patient is status post left total hip arthroplasty. The hardware components are in anatomic alignment and there are no complications. There is no periprosthetic fracture or dislocation identified.  IMPRESSION: 1. No complications after left hip arthroplasty.   Electronically Signed   By: Kerby Moors M.D.   On: 11/08/2014 13:46   Dg Hip Unilat With Pelvis 2-3 Views Left  11/13/2014   CLINICAL DATA:  Left hip replacement performed on Tuesday. Acute pain today.  EXAM: LEFT HIP (WITH PELVIS) 2-3 VIEWS  COMPARISON:  11/08/2014 postoperative study  FINDINGS: Left total hip arthroplasty identified.  There has been interval migration/protrusio of the acetabular component superiorly and medially with rotation anteriorly.  The femoral head component appears seated.  No other acute abnormalities are identified.  IMPRESSION: Interval migration/protrusio and rotation of the acetabular component of left hip replacement. Femoral head component appears seated.   Electronically Signed   By: Margarette Canada M.D.   On: 11/13/2014 12:29    ASSESSMENT:  S/P left THR with loss of fixation acetabular component   PLAN:  Albert has experienced migration of his acetabular component.  No good alternative to revision of  this single component.  Femoral side looks fine.  Will likely revise to larger cup and augment with screws.  Will obtain CT now to make sure we aren't dealing with a fracture but plain films give no indication  of that.  Will enlist help of co-surgeon and try to go forward this afternoon, Sunday.  Zalea Pete G 11/13/2014, 1:18 PM

## 2014-11-13 NOTE — Transfer of Care (Signed)
Immediate Anesthesia Transfer of Care Note  Patient: [name]  Procedure(s) Performed: Procedure(s): REVISION OF LEFT HIP ARTHROPLASTY ANTERIOR APPROACH (Left)  Patient Location: PACU  Anesthesia Type:MAC and Spinal  Level of Consciousness: awake, alert , oriented and patient cooperative  Airway & Oxygen Therapy: Patient Spontanous Breathing and Patient connected to nasal cannula oxygen  Post-op Assessment: Report given to RN and Post -op Vital signs reviewed and stable  Post vital signs: Reviewed and stable  Last Vitals:  Filed Vitals:   11/13/14 1445  BP: 128/73  Pulse: 84  Temp:   Resp: 18    Complications: No apparent anesthesia complications

## 2014-11-13 NOTE — Op Note (Signed)
PRE-OP DIAGNOSIS:  LOOSE LEFT THR POST-OP DIAGNOSIS: same PROCEDURE:  LEFT TOTAL HIP ARTHROPLASTY REVISION ANESTHESIA:  Spinal and MAC SURGEON:  Melrose Nakayama MD ASSISTANT:  Zollie Beckers MD and Eduard Roux MD   INDICATIONS FOR PROCEDURE:  The patient is a 69 y.o. male with a long history of a painful hip.  This persisted despite multiple conservative measures and led to THR last week.  He did very well but early today suffered an increase in pain.  Xray shows a displaced acetabular component.  A total hip replacement revision is offered as surgical treatment.  Informed operative consent was obtained after discussion of possible complications including reaction to anesthesia, infection, neurovascular injury, dislocation, DVT, PE, and death.  The importance of the postoperative rehab program to optimize result was stressed with the patient.  SUMMARY OF FINDINGS AND PROCEDURE:  Under general anesthesia through a anterior approach an the Hana table a left THR revision was performed.  The patient had  fair bone quality.  We used DePuy components to revise the acetabular side with a size 58 Gription shell with a plus 4 neutral polyethylene liner.  We did not use a hole eliminator and placed three screws in the acetabulum.  We also used some cancellous bone graft medially.  I used fluoroscopy throughout the case to check position of components and leg lengths and read all these views myself.  DESCRIPTION OF PROCEDURE:  The patient was taken to the OR suite where general anesthetic was applied.  The patient was then positioned on the Hana table supine.  All bony prominences were appropriately padded.  Prep and drape was then performed in normal sterile fashion.  The patient was given kefzol preoperative antibiotic and an appropriate time out was performed.  We then used the old anterior approach to the left hip.  Dissection was taken through adipose to the tensor fascia lata fascia.  This structure was incised  longitudinally and we dissected in the intermuscular interval just medial to this muscle. The hip was dislocated and the ceramic femoral head was removed easily.  The acetabulum was exposed and some labral tissues were excised. The old component was easily removed. Reaming was taken to the appropriate position up to 58 with good feel under fluoro guidance.  We placed some cancellous bone graft medial followed by a  58 Gription acetabular shell placed in appropriate tilt and anteversion confirmed by fluoroscopy. The liner was placed after three screws for added fixation  A trial reduction was done and the +8.5 38mm ceramic head gave [place] good stability in extension with external rotation.  Leg lengths were felt to be about equal by fluoroscopic exam.  The head was applied to a dry stem neck and the hip again reduced.  It was again stable in the aforementioned positions.  The would was irrigated again followed by re-approximation of anterior capsule with ethibond suture. Tensor fascia was repaired with V-loc suture  followed by subcutaneous closure with #O and #2 undyed vicryl.  Skin was closed with staples followed by a sterile dressing.  EBL and IOF can be obtained from anesthesia records.  DISPOSITION:  The patient was extubated in the OR and taken to PACU in stable condition to be admitted to the Orthopedic Surgery for appropriate post-op care to include perioperative antibiotics and DVT prophylaxis.

## 2014-11-14 LAB — CBC
HCT: 28.5 % — ABNORMAL LOW (ref 39.0–52.0)
Hemoglobin: 9.5 g/dL — ABNORMAL LOW (ref 13.0–17.0)
MCH: 30.4 pg (ref 26.0–34.0)
MCHC: 33.3 g/dL (ref 30.0–36.0)
MCV: 91.1 fL (ref 78.0–100.0)
Platelets: 216 10*3/uL (ref 150–400)
RBC: 3.13 MIL/uL — AB (ref 4.22–5.81)
RDW: 13.2 % (ref 11.5–15.5)
WBC: 7.7 10*3/uL (ref 4.0–10.5)

## 2014-11-14 LAB — BASIC METABOLIC PANEL
ANION GAP: 7 (ref 5–15)
BUN: 16 mg/dL (ref 6–20)
CO2: 27 mmol/L (ref 22–32)
Calcium: 8 mg/dL — ABNORMAL LOW (ref 8.9–10.3)
Chloride: 99 mmol/L — ABNORMAL LOW (ref 101–111)
Creatinine, Ser: 0.84 mg/dL (ref 0.61–1.24)
GFR calc non Af Amer: >60 mL/min (ref >60)
Glucose, Bld: 148 mg/dL — ABNORMAL HIGH (ref 65–99)
POTASSIUM: 4.4 mmol/L (ref 3.5–5.1)
Sodium: 133 mmol/L — ABNORMAL LOW (ref 135–145)

## 2014-11-14 NOTE — Evaluation (Signed)
Physical Therapy Evaluation Patient Details Name: [name] MRN: [id number] DOB: 04/07/1946 Today's Date: 11/14/2014   History of Present Illness  Patient is a 69 y/o male s/p L THA, now w/ L total hip revision, TDWB LLE. PMH of HTN, HLD, ED.  Clinical Impression  Patient is s/p above surgery resulting in functional limitations due to the deficits listed below (see PT Problem List). Pt limited by pain in LLE this session and pt demonstrated difficulty adhering to WB status.  Pt will be most appropriate for d/c to SNF 2/2 currently no assist at home and 4 steps to enter home w/ railing, in addition to new TDWB status.   Patient will benefit from skilled PT to increase their independence and safety with mobility to allow discharge to the venue listed below.       Follow Up Recommendations SNF;Supervision for mobility/OOB    Equipment Recommendations       Recommendations for Other Services       Precautions / Restrictions Precautions Precautions: Fall Precaution Booklet Issued: Yes (comment) Precaution Comments: Direct anterior approach, no precautions. Restrictions Weight Bearing Restrictions: Yes LLE Weight Bearing: Touchdown weight bearing      Mobility  Bed Mobility Overal bed mobility: Needs Assistance Bed Mobility: Supine to Sit     Supine to sit: Min assist;HOB elevated     General bed mobility comments: Min assist to maintain balance during supine>sit, pt w/ posterior lean.  Pt w/ max use of bed rails and requires HOB to be elevated to sit EOB.  Max verbal cue for technique to scoot toward EOB.  Transfers Overall transfer level: Needs assistance Equipment used: Rolling walker (2 wheeled) Transfers: Sit to/from Stand Sit to Stand: Min assist;From elevated surface         General transfer comment: Min assist to power up to standing.  Max verbal cues for hand placement and to lean forward during sit>stand.  Pt w/ poor adherence to TDWB status of LLE during  transfer despite verbal and tactile cues.  Ambulation/Gait Ambulation/Gait assistance: Min guard Ambulation Distance (Feet): 5 Feet Assistive device: Rolling walker (2 wheeled) Gait Pattern/deviations: Step-to pattern;Shuffle;Decreased stride length;Decreased stance time - left;Decreased weight shift to left;Antalgic;Trunk flexed   Gait velocity interpretation: Below normal speed for age/gender General Gait Details: Pt w/ poor adherence initially for TDWB status for LLE, however pt demonstrated improvement toward end of ambulatory distance.  Max cues for technique to adhere to WB precautions.    Stairs            Wheelchair Mobility    Modified Rankin (Stroke Patients Only)       Balance Overall balance assessment: Needs assistance Sitting-balance support: Bilateral upper extremity supported;Feet supported Sitting balance-Leahy Scale: Fair Sitting balance - Comments: Pt holding onto bed and bed rail to avoid leaning posteriorly Postural control: Posterior lean Standing balance support: Bilateral upper extremity supported;During functional activity Standing balance-Leahy Scale: Poor                               Pertinent Vitals/Pain Pain Assessment: 0-10 Pain Score: 9  Pain Location: L hip Pain Descriptors / Indicators: Crushing;Guarding;Grimacing;Moaning Pain Intervention(s): Limited activity within patient's tolerance;Monitored during session;Repositioned;Patient requesting pain meds-RN notified;RN gave pain meds during session    Home Living Family/patient expects to be discharged to:: Private residence Living Arrangements: Spouse/significant other Available Help at Discharge: Other (Comment) (Pt working on arrangements upon d/c today, see notes  below) Type of Home: House Home Access: Stairs to enter Entrance Stairs-Rails: None Entrance Stairs-Number of Steps: 4 Home Layout: Two level;Able to live on main level with bedroom/bathroom Home Equipment:  Gilford Rile - 2 wheels;Crutches;Tub bench Additional Comments: pt has counter on left side beside BSC (will not fit on regular 3N1).   Pt lives alone w/ wife who works and is unable to provide physical assist.  Pt's son may be moving back home w/ pt and pt's wife, pt is to discuss these plans w/ son this afternoon.  Even if pt's son returns home and can provide 24/7 assist pt will still benefit from [facility] in light of new WB status of LLE.  Pt w/ poor adherence to WB status this session.    Prior Function Level of Independence: Independent with assistive device(s)         Comments: RW independently since recent L THA     Hand Dominance        Extremity/Trunk Assessment               Lower Extremity Assessment: LLE deficits/detail   LLE Deficits / Details: Limited ROM and weakness 2/2 pain s/p L total hip revision     Communication   Communication: No difficulties  Cognition Arousal/Alertness: Awake/alert Behavior During Therapy: WFL for tasks assessed/performed Overall Cognitive Status: Within Functional Limits for tasks assessed                      General Comments      Exercises Total Joint Exercises Ankle Circles/Pumps: AROM;Both;10 reps;Supine Heel Slides: AROM;AAROM;Left;10 reps;Supine      Assessment/Plan    PT Assessment Patient needs continued PT services  PT Diagnosis Difficulty walking;Abnormality of gait;Generalized weakness;Acute pain   PT Problem List Decreased strength;Decreased range of motion;Decreased activity tolerance;Decreased balance;Decreased mobility;Decreased coordination;Decreased knowledge of use of DME;Decreased safety awareness;Decreased knowledge of precautions;Obesity;Decreased skin integrity;Pain  PT Treatment Interventions DME instruction;Gait training;Stair training;Functional mobility training;Therapeutic activities;Therapeutic exercise;Balance training;Neuromuscular re-education;Patient/family education;Modalities   PT Goals  (Current goals can be found in the Care Plan section) Acute Rehab PT Goals Patient Stated Goal: to have his pain decrease PT Goal Formulation: With patient Time For Goal Achievement: 11/21/14 Potential to Achieve Goals: Good    Frequency 7X/week   Barriers to discharge Decreased caregiver support;Inaccessible home environment Potentially no assist at home; 4 steps to enter home w/ no rails    Co-evaluation               End of Session Equipment Utilized During Treatment: Gait belt Activity Tolerance: Patient limited by fatigue;Patient limited by pain Patient left: in chair;with call bell/phone within reach Nurse Communication: Mobility status;Precautions;Weight bearing status;Patient requests pain meds         Time: 0258-5277 PT Time Calculation (min) (ACUTE ONLY): 41 min   Charges:   PT Evaluation $Initial PT Evaluation Tier I: 1 Procedure PT Treatments $Gait Training: 8-22 mins $Therapeutic Exercise: 8-22 mins   PT G Codes:       Joslyn Hy PT, DPT [phone] Pager: [phone] 11/14/2014, 10:59 AM

## 2014-11-14 NOTE — Progress Notes (Signed)
Occupational Therapy Evaluation Patient Details Name: [name] MRN: [id number] DOB: 1945-07-28 Today's Date: 11/14/2014    History of Present Illness Patient is a 69 y/o male s/p L THA, now w/ L total hip revision, TDWB LLE. PMH of HTN, HLD, ED.   Clinical Impression   PTA, pt mod I with mobility and ADL. Pt currently requires mod A for ADL and min A for mobility for ADL. Feel pt will benefit from rehab at SNF to facilitate safe return home. Will follow acutely to facilitate D/C to next venue of care.    Follow Up Recommendations  SNF;Supervision/Assistance - 24 hour    Equipment Recommendations  3 in 1 bedside comode    Recommendations for Other Services       Precautions / Restrictions Precautions Precautions: Fall Precaution Booklet Issued: Yes (comment) Precaution Comments: Direct anterior approach, no precautions. Restrictions Weight Bearing Restrictions: Yes LLE Weight Bearing: Touchdown weight bearing      Mobility Bed Mobility Overal bed mobility: Needs Assistance Bed Mobility: Supine to Sit     Supine to sit: HOB elevated Sit to supine: Min assist   General bed mobility comments: min A to lift LLE back onto bed  Transfers Overall transfer level: Needs assistance Equipment used: Rolling walker (2 wheeled) Transfers: Sit to/from Stand Sit to Stand: Min assist;From elevated surface         General transfer comment:difficulty maintaining TDWB status    Balance Overall balance assessment: Needs assistance Sitting-balance support: Bilateral upper extremity supported;Feet supported Sitting balance-Leahy Scale: Fair Sitting balance - Comments: Pt holding onto bed and bed rail to avoid leaning posteriorly Postural control: Posterior lean Standing balance support: During functional activity Standing balance-Leahy Scale: Poor (heavy reliance on RW)                              ADL Overall ADL's : Needs assistance/impaired      Grooming: Set up;Sitting   Upper Body Bathing: Set up;Sitting   Lower Body Bathing: Moderate assistance;Sit to/from stand   Upper Body Dressing : Set up;Sitting   Lower Body Dressing: Maximal assistance;Sit to/from stand   Toilet Transfer: Minimal assistance;Stand-pivot   Toileting- Clothing Manipulation and Hygiene: Minimal assistance       Functional mobility during ADLs: Minimal assistance General ADL Comments: Educated pt on compensatory strategies for mobility for                      Pertinent Vitals/Pain Pain Assessment: 0-10 Pain Score: 8  Pain Location: L hip Pain Descriptors / Indicators: Aching Pain Intervention(s): Limited activity within patient's tolerance;Monitored during session;Repositioned;Ice applied     Hand Dominance     Extremity/Trunk Assessment     Lower Extremity Assessment Lower Extremity Assessment: LLE deficits/detail LLE Deficits / Details: Limited ROM and weakness 2/2 pain s/p L total hip revision LLE Sensation:  (WFL)   Cervical / Trunk Assessment Cervical / Trunk Assessment: Normal   Communication Communication Communication: No difficulties   Cognition Arousal/Alertness: Awake/alert Behavior During Therapy: WFL for tasks assessed/performed Overall Cognitive Status: Within Functional Limits for tasks assessed                     General Comments   very talkative    Exercises      Shoulder Instructions      Home Living Family/patient expects to be discharged to:: Private residence Living Arrangements: Spouse/significant other Available  Help at Discharge: Other (Comment) (Pt working on arrangements upon d/c today, see notes below) Type of Home: House Home Access: Stairs to enter [employer] of Steps: 4 Entrance Stairs-Rails: None Home Layout: Two level;Able to live on main level with bedroom/bathroom Alternate Level Stairs-Number of Steps: 1 flight   Bathroom Shower/Tub: Tub/shower  unit Shower/tub characteristics: Architectural technologist: Standard Bathroom Accessibility: Yes   Home Equipment: Gilford Rile - 2 wheels;Crutches;Tub bench;Toilet riser   Additional Comments: pt has counter on left side beside BSC (will not fit on regular 3N1).   Pt lives alone w/ wife who works and is unable to provide physical assist due to recent shoulder injury.  Pt's son may be moving back home w/ pt and pt's wife, pt is to discuss these plans w/ son this afternoon.  Even if pt's son returns home and can provide 24/7 assist pt will still benefit from [facility] in light of new WB status of LLE.  Pt w/ poor adherence to WB status this session.      Prior Functioning/Environment Level of Independence: Independent with assistive device(s)        Comments: RW independently since recent L THA    OT Diagnosis: Generalized weakness;Acute pain   OT Problem List: Decreased strength;Decreased activity tolerance;Decreased range of motion;Impaired balance (sitting and/or standing);Decreased safety awareness;Decreased knowledge of use of DME or AE;Obesity;Pain;Increased edema   OT Treatment/Interventions: Self-care/ADL training;DME and/or AE instruction;Therapeutic activities;Patient/family education;Balance training    OT Goals(Current goals can be found in the care plan section) Acute Rehab OT Goals Patient Stated Goal: to have his pain decrease OT Goal Formulation: With patient Time For Goal Achievement: 11/28/14  OT Frequency: Min 2X/week   Barriers to D/C: Decreased caregiver support (wife with recent shoulder injury)          Co-evaluation              End of Session Equipment Utilized During Treatment: Gait belt;Rolling walker Nurse Communication: Mobility status;Precautions;Weight bearing status  Activity Tolerance: Patient tolerated treatment well Patient left: in bed;with call bell/phone within reach   Time: 1238-1320 OT Time Calculation (min): 42 min Charges:  OT General  Charges $OT Visit: 1 Procedure OT Evaluation $Initial OT Evaluation Tier I: 1 Procedure OT Treatments $Self Care/Home Management : 23-37 mins G-Codes:    Sherica Paternostro,HILLARY December 13, 2014, 1:44 PM   [facility], OTR/L  [phone] 12-13-14

## 2014-11-14 NOTE — Progress Notes (Signed)
Subjective: 1 Day Post-Op Procedure(s) (LRB): REVISION OF LEFT HIP ARTHROPLASTY ANTERIOR APPROACH (Left)   Patient resting comfortably in bed. He states that his right eye is feeling better today than last night. He is hesitant about going to SNF and is asking about the cost.   Activity level:  touch down left leg Diet tolerance:  ok Voiding:  ok Patient reports pain as mild and moderate.    Objective: Vital signs in last 24 hours: Temp:  [98.1 F (36.7 C)-99 F (37.2 C)] 99 F (37.2 C) (05/16 0416) Pulse Rate:  [74-103] 103 (05/16 0416) Resp:  [12-22] 16 (05/16 0416) BP: (98-128)/(51-73) 108/51 mmHg (05/16 0416) SpO2:  [86 %-100 %] 96 % (05/16 0416)  Labs:  Recent Labs  11/13/14 1319 11/13/14 1335 11/14/14 0550  HGB 11.5* 12.2* 9.5*    Recent Labs  11/13/14 1319 11/13/14 1335 11/14/14 0550  WBC 7.8  --  7.7  RBC 3.81*  --  3.13*  HCT 34.4* 36.0* 28.5*  PLT 214  --  216    Recent Labs  11/13/14 1335 11/14/14 0550  NA 137 133*  K 4.5 4.4  CL 98* 99*  CO2  --  27  BUN 16 16  CREATININE 0.80 0.84  GLUCOSE 129* 148*  CALCIUM  --  8.0*    Recent Labs  11/13/14 1319  INR 1.07    Physical Exam:  Neurologically intact ABD soft Neurovascular intact Sensation intact distally Intact pulses distally Dorsiflexion/Plantar flexion intact Incision: dressing C/D/I and no drainage No cellulitis present Compartment soft  Assessment/Plan:  1 Day Post-Op Procedure(s) (LRB): REVISION OF LEFT HIP ARTHROPLASTY ANTERIOR APPROACH (Left) Advance diet Up with therapy Plan for discharge tomorrow Discharge to SNF [facility] or [facility] if patient is willing and doing well. Continue on ASA 325mg  BID x 4 weeks for DVT prevention. Follow up in office 2 weeks. Touchdown weightbearing only on left leg.   Kristina Mcnorton, Larwance Sachs 11/14/2014, 10:02 AM

## 2014-11-14 NOTE — Progress Notes (Signed)
CSW received order to assist patient with SNF placement- short term rehab.  CSW notified by [facility]- Ricki Miller that he wants to be notified of co-pays in the SNF prior to agreeing to SNF search.  CSW has attempted throughout the day to ascertain his insurance coverage and awaiting call back re: this information. Will hopefully receive information in the morning.  PT/OT recommend short term SNF.   Lorie Phenix. Pauline Good, [place]

## 2014-11-15 ENCOUNTER — Encounter (HOSPITAL_COMMUNITY): Payer: Self-pay | Admitting: General Practice

## 2014-11-15 LAB — CBC
HCT: 25.4 % — ABNORMAL LOW (ref 39.0–52.0)
Hemoglobin: 8.4 g/dL — ABNORMAL LOW (ref 13.0–17.0)
MCH: 30 pg (ref 26.0–34.0)
MCHC: 33.1 g/dL (ref 30.0–36.0)
MCV: 90.7 fL (ref 78.0–100.0)
Platelets: 220 10*3/uL (ref 150–400)
RBC: 2.8 MIL/uL — ABNORMAL LOW (ref 4.22–5.81)
RDW: 13.2 % (ref 11.5–15.5)
WBC: 10.6 10*3/uL — ABNORMAL HIGH (ref 4.0–10.5)

## 2014-11-15 MED ORDER — ASPIRIN EC 325 MG PO TBEC
325.0000 mg | DELAYED_RELEASE_TABLET | Freq: Two times a day (BID) | ORAL | Status: DC
  Administered 2014-11-15 – 2014-11-16 (×3): 325 mg via ORAL
  Filled 2014-11-15 (×2): qty 1

## 2014-11-15 NOTE — Clinical Social Work Note (Signed)
Clinical Social Work Assessment  Patient Details  Name: [name] MRN: [id number] Date of Birth: May 29, 1946  Date of referral:  11/14/14               Reason for consult:  Facility Placement                Permission sought to share information with:  Family Supports Permission granted to share information::  Yes, Verbal Permission Granted  Name::     WifeSula Soda; OK to send out to [place]         Housing/Transportation Living arrangements for the past 2 months:  [place] of Information:  Patient Patient Interpreter Needed:  None Criminal Activity/Legal Involvement Pertinent to Current Situation/Hospitalization:  No - Comment as needed Significant Relationships:  Spouse, Adult Children Lives with:  Spouse Do you feel safe going back to the place where you live?  No Need for family participation in patient care:  No (Coment)  Care giving concerns:  Patient verbalized concerns that he would have to be home alone if he d/c'd home as his wife works during the day.  He is currently non-weight bearing on his left leg and states his right leg is in poor condition. Recent d/c home from the hospital and had to return for hip revision.  MD states will be stable for d/c tomorrow.    Social Worker assessment / plan:   69 year old male admitted from home where he lives with his wife.  Patient is normally self sufficient of his ADL's and very active- still works, Psychologist, counselling.  He had a hip revision and now will require restorative therapies for ambulation. He is anxious to get rehab to return to prior independent status. Fl2 completed and placed on chart for MD's signature.  Patient has SNF options and will discuss with his wife tonight.   Employment status:  Transport planner, Medicare PT Recommendations:  [place] / Referral to community resources:  [place]  Patient/Family's Response to care:   Patient faces a delimma about SNF placement as he will a copay with his current insurance.  He feels that he will not be safe to return home alone as his wife work but is anxious to seek rehab to resume his prior independent status.  He feels that he would progress in SNF.    Patient/Family's Understanding of and Emotional Response to Diagnosis, Current Treatment, and Prognosis:  Patient is able to verbalize an excellent understanding of his current diagnosis and treatment and is extremely involved in his treatment plan. He is alert, oriented and is making all his own health care decisions- however, he wants to discuss with his wife tonight as there is a Research officer, trade union to this that will affect his choice of SNF vs home.   Emotional Assessment Appearance:  Appears stated age Attitude/Demeanor/Rapport:    Affect (typically observed):  Pleasant, Calm Orientation:  Oriented to Self, Oriented to Place, Oriented to  Time, Oriented to Situation Alcohol / Substance use:  Tobacco Use, Alcohol Use (hx of smoking; current use of ETOH) Psych involvement (Current and /or in the community):  No (Comment)  Discharge Needs  Concerns to be addressed:  Financial / Insurance Concerns, Care Coordination Readmission within the last 30 days:  Yes Current discharge risk:  Dependent with Mobility Barriers to Discharge:  No Barriers Identified   Williemae Area, LCSW 11/15/2014, 3:00 PM

## 2014-11-15 NOTE — Progress Notes (Signed)
Subjective: 2 Days Post-Op Procedure(s) (LRB): REVISION OF LEFT HIP ARTHROPLASTY ANTERIOR APPROACH (Left)  Activity level:  Touchdown weightbearing left leg Diet tolerance:  ok Voiding:  ok Patient reports pain as mild.    Objective: Vital signs in last 24 hours: Temp:  [98.7 F (37.1 C)-98.8 F (37.1 C)] 98.8 F (37.1 C) (05/17 0423) Pulse Rate:  [92-104] 92 (05/17 0423) Resp:  [16-18] 18 (05/17 0423) BP: (108-130)/(51-58) 115/54 mmHg (05/17 0423) SpO2:  [96 %] 96 % (05/17 0423)  Labs:  Recent Labs  11/13/14 1319 11/13/14 1335 11/14/14 0550 11/15/14 0505  HGB 11.5* 12.2* 9.5* 8.4*    Recent Labs  11/14/14 0550 11/15/14 0505  WBC 7.7 10.6*  RBC 3.13* 2.80*  HCT 28.5* 25.4*  PLT 216 220    Recent Labs  11/13/14 1335 11/14/14 0550  NA 137 133*  K 4.5 4.4  CL 98* 99*  CO2  --  27  BUN 16 16  CREATININE 0.80 0.84  GLUCOSE 129* 148*  CALCIUM  --  8.0*    Recent Labs  11/13/14 1319  INR 1.07    Physical Exam:  Neurologically intact ABD soft Neurovascular intact Sensation intact distally Intact pulses distally Dorsiflexion/Plantar flexion intact Incision: dressing C/D/I and no drainage No cellulitis present Compartment soft  Assessment/Plan:  2 Days Post-Op Procedure(s) (LRB): REVISION OF LEFT HIP ARTHROPLASTY ANTERIOR APPROACH (Left) Advance diet Up with therapy Plan for discharge tomorrow if doing well and cleared by PT. D/C home vs SNF pending PT. Continue on ASA 325mg  BID x 4 weeks post op. Follow up in office 2 weeks post op.    Channa Hazelett, Larwance Sachs 11/15/2014, 10:20 AM

## 2014-11-15 NOTE — Clinical Documentation Improvement (Signed)
Lab reports hemoglobin for this patient to have been  11.5 on May 15, and 8.4 on May 17.  EBL per anesthesia report = 300mg .   Is it possible to further specify this patient's drop in hemoglobin after surgery on May 15, for example:  Acute post op blood loss anemia Expected post op blood loss anemia Normal hemoglobin values Unable to suspect or determine Other, please specify:    Thank You, Carrolyn Meiers, RN [email or web address] [phone]

## 2014-11-15 NOTE — Progress Notes (Signed)
Physical Therapy Treatment Patient Details Name: [name] MRN: [id number] DOB: [birth year] Today's Date: 11/15/2014    History of Present Illness Patient is a 69 y/o male s/p L THA, now w/ L total hip revision, TDWB LLE. PMH of HTN, HLD, ED.    PT Comments    Patient having a difficult time with TWB and inability to hop due to R knee pain. Patient is considering going home at [facility] level for mobility. He is discussing with family to ensure that he will have help that is needed. If [place] home he will need Wheelchair and WC cushion as well as ambulance transport home as he is unable to hop up 4 steps at this time. Will continue to follow for ongoing needs and change of plan if need be.   Follow Up Recommendations  SNF;Supervision for mobility/OOB     Equipment Recommendations  Wheelchair (measurements PT);Wheelchair cushion (measurements PT)    Recommendations for Other Services       Precautions / Restrictions Precautions Precautions: Fall Precaution Comments: Direct anterior approach, no precautions. Restrictions Weight Bearing Restrictions: Yes LLE Weight Bearing: Touchdown weight bearing    Mobility  Bed Mobility Overal bed mobility: Needs Assistance Bed Mobility: Supine to Sit       Sit to supine: Min assist   General bed mobility comments: min A to lift LLE  Transfers Overall transfer level: Needs assistance Equipment used: Rolling walker (2 wheeled) Transfers: Stand Pivot Transfers Sit to Stand: Min assist;From elevated surface         General transfer comment: Min assist to power up to standing.  Max verbal cues for hand placement and to lean forward during sit>stand.  Pt w/ poor adherence to TDWB status of LLE during transfer despite verbal and tactile cues.  Ambulation/Gait Ambulation/Gait assistance: Min assist Ambulation Distance (Feet): 5 Feet Assistive device: Rolling walker (2 wheeled) Gait Pattern/deviations: Step-to pattern;Shuffle      General Gait Details: Pt w/ poor adherence initially for TDWB status for LLE, however pt demonstrated improvement toward end of ambulatory distance.  Max cues for technique to adhere to WB precautions.  Patient stated could not ambulate more due to sore R knee.    Stairs            Wheelchair Mobility    Modified Rankin (Stroke Patients Only)       Balance                                    Cognition Arousal/Alertness: Awake/alert Behavior During Therapy: WFL for tasks assessed/performed Overall Cognitive Status: Within Functional Limits for tasks assessed                      Exercises      General Comments        Pertinent Vitals/Pain Pain Score: 6  Pain Location: l hip Pain Descriptors / Indicators: Aching;Sore Pain Intervention(s): Monitored during session    Home Living                      Prior Function            PT Goals (current goals can now be found in the care plan section) Progress towards PT goals: Progressing toward goals    Frequency  7X/week    PT Plan Current plan remains appropriate    Co-evaluation  End of Session Equipment Utilized During Treatment: Gait belt Activity Tolerance: Patient limited by fatigue;Patient limited by pain Patient left: in chair;with call bell/phone within reach     Time: 1005-1030 PT Time Calculation (min) (ACUTE ONLY): 25 min  Charges:  $Gait Training: 8-22 mins $Therapeutic Activity: 8-22 mins                    G Codes:      Jacqualyn Posey 11/15/2014, 11:56 AM 11/15/2014 Jacqualyn Posey PTA [phone] pager [phone] office

## 2014-11-15 NOTE — Clinical Social Work Placement (Signed)
   CLINICAL SOCIAL WORK PLACEMENT  NOTE  Date:  11/15/2014  Patient Details  Name: LERAY GARVERICK MRN: [id number] Date of Birth: 1945/12/11  Clinical Social Work is seeking post-discharge placement for this patient at the [place] level of care (*CSW will initial, date and re-position this form in  chart as items are completed):  Yes   Patient/family provided with [place] Work Department's list of facilities offering this level of care within the geographic area requested by the patient (or if unable, by the patient's family).  Yes   Patient/family informed of their freedom to choose among providers that offer the needed level of care, that participate in Medicare, Medicaid or managed care program needed by the patient, have an available bed and are willing to accept the patient.  Yes   Patient/family informed of [place]'s ownership interest in [facility] and [facility], as well as of the fact that they are under no obligation to receive care at these facilities.  PASRR submitted to EDS on 11/15/14     PASRR number received on 11/15/14     Existing PASRR number confirmed on       FL2 transmitted to all facilities in geographic area requested by pt/family on 11/08/14     FL2 transmitted to all facilities within larger geographic area on       Patient informed that his/her managed care company has contracts with or will negotiate with certain facilities, including the following:   ([place])     Yes   Patient/family informed of bed offers received.  Patient chooses bed at       Physician recommends and patient chooses bed at      Patient to be transferred to   on  .  Patient to be transferred to facility by       Patient family notified on   of transfer.  Name of family member notified:        PHYSICIAN Please prepare priority discharge summary, including medications, Please sign FL2, Please prepare  prescriptions     Additional Comment:    _______________________________________________ Williemae Area, LCSW 11/15/2014, 2:46 PM

## 2014-11-16 ENCOUNTER — Encounter (HOSPITAL_COMMUNITY): Payer: Self-pay | Admitting: Orthopaedic Surgery

## 2014-11-16 DIAGNOSIS — D62 Acute posthemorrhagic anemia: Secondary | ICD-10-CM | POA: Clinically undetermined

## 2014-11-16 LAB — CBC
HCT: 25.3 % — ABNORMAL LOW (ref 39.0–52.0)
Hemoglobin: 8.4 g/dL — ABNORMAL LOW (ref 13.0–17.0)
MCH: 29.9 pg (ref 26.0–34.0)
MCHC: 33.2 g/dL (ref 30.0–36.0)
MCV: 90 fL (ref 78.0–100.0)
Platelets: 249 10*3/uL (ref 150–400)
RBC: 2.81 MIL/uL — ABNORMAL LOW (ref 4.22–5.81)
RDW: 13.2 % (ref 11.5–15.5)
WBC: 8.4 10*3/uL (ref 4.0–10.5)

## 2014-11-16 NOTE — Progress Notes (Signed)
Subjective: 3 Days Post-Op Procedure(s) (LRB): REVISION OF LEFT HIP ARTHROPLASTY ANTERIOR APPROACH (Left)  Activity level: Touchdown weightbearing left leg Diet tolerance:  ok Voiding:  ok Patient reports pain as mild.    Objective: Vital signs in last 24 hours: Temp:  [98.7 F (37.1 C)-100.1 F (37.8 C)] 98.7 F (37.1 C) (05/18 0531) Pulse Rate:  [85-103] 85 (05/18 0531) Resp:  [18] 18 (05/18 0531) BP: (99-115)/(52-58) 114/53 mmHg (05/18 0531) SpO2:  [94 %-98 %] 96 % (05/18 0531)  Labs:  Recent Labs  11/13/14 1319 11/13/14 1335 11/14/14 0550 11/15/14 0505 11/16/14 0500  HGB 11.5* 12.2* 9.5* 8.4* 8.4*    Recent Labs  11/15/14 0505 11/16/14 0500  WBC 10.6* 8.4  RBC 2.80* 2.81*  HCT 25.4* 25.3*  PLT 220 249    Recent Labs  11/13/14 1335 11/14/14 0550  NA 137 133*  K 4.5 4.4  CL 98* 99*  CO2  --  27  BUN 16 16  CREATININE 0.80 0.84  GLUCOSE 129* 148*  CALCIUM  --  8.0*    Recent Labs  11/13/14 1319  INR 1.07    Physical Exam:  Neurologically intact ABD soft Neurovascular intact Sensation intact distally Intact pulses distally Dorsiflexion/Plantar flexion intact Incision: dressing C/D/I and scant drainage No cellulitis present Compartment soft  Assessment/Plan:  3 Days Post-Op Procedure(s) (LRB): REVISION OF LEFT HIP ARTHROPLASTY ANTERIOR APPROACH (Left) Advance diet Up with therapy Discharge to SN[facility] today. Touchdown weightbearing only on left leg. Continune on ASA 325mg  BID x 4 weeks post op. Follow up in office 2 weeks post op.   Brisha Mccabe, Larwance Sachs 11/16/2014, 8:21 AM

## 2014-11-16 NOTE — Discharge Summary (Signed)
Patient ID: [name] MRN: [id number] DOB/AGE: Jul 20, 1945 69 y.o.  Admit date: 11/13/2014 Discharge date: 11/16/2014  Admission Diagnoses:  Principal Problem:   Status post revision of total hip Active Problems:   Morbid obesity   Postoperative anemia due to acute blood loss   Discharge Diagnoses:  Same  Past Medical History  Diagnosis Date  . Hyperlipidemia     takes Simvastatin daily  . Osteoarthritis     knee  . ED (erectile dysfunction)     takes Viagra as needed  . Hypertension     takes Lisinopril daily  . Joint pain   . History of bronchitis last time about 17yrs ago    Surgeries: Procedure(s): REVISION OF LEFT HIP ARTHROPLASTY ANTERIOR APPROACH on 11/13/2014   Consultants:    Discharged Condition: Improved  Hospital Course: COMPTON BRIGANCE is an 69 y.o. male who was admitted 11/13/2014 for operative treatment ofStatus post revision of total hip. Patient has severe unremitting pain that affects sleep, daily activities, and work/hobbies. After pre-op clearance the patient was taken to the operating room on 11/13/2014 and underwent  Procedure(s): REVISION OF LEFT HIP ARTHROPLASTY ANTERIOR APPROACH.    Patient was given perioperative antibiotics: Anti-infectives    Start     Dose/Rate Route Frequency Ordered Stop   11/13/14 2130  ceFAZolin (ANCEF) IVPB 2 g/50 mL premix     2 g 100 mL/hr over 30 Minutes Intravenous Every 6 hours 11/13/14 2015 11/14/14 0430       Patient was given sequential compression devices, early ambulation, and chemoprophylaxis to prevent DVT.  Patient benefited maximally from hospital stay and there were no complications.    Recent vital signs: Patient Vitals for the past 24 hrs:  BP Temp Pulse Resp SpO2  11/16/14 1240 108/68 mmHg 99 F (37.2 C) 85 18 97 %  11/16/14 0531 (!) 114/53 mmHg 98.7 F (37.1 C) 85 18 96 %  11/15/14 2039 (!) 113/58 mmHg 100.1 F (37.8 C) (!) 103 18 94 %     Recent laboratory studies:  Recent Labs   11/13/14 1319 11/13/14 1335 11/14/14 0550 11/15/14 0505 11/16/14 0500  WBC 7.8  --  7.7 10.6* 8.4  HGB 11.5* 12.2* 9.5* 8.4* 8.4*  HCT 34.4* 36.0* 28.5* 25.4* 25.3*  PLT 214  --  216 220 249  NA  --  137 133*  --   --   K  --  4.5 4.4  --   --   CL  --  98* 99*  --   --   CO2  --   --  27  --   --   BUN  --  16 16  --   --   CREATININE  --  0.80 0.84  --   --   GLUCOSE  --  129* 148*  --   --   INR 1.07  --   --   --   --   CALCIUM  --   --  8.0*  --   --      Discharge Medications:     Medication List    TAKE these medications        aspirin 325 MG EC tablet  Take 1 tablet (325 mg total) by mouth 2 (two) times daily after a meal.     cyanocobalamin 1000 MCG tablet  Take 1,000 mcg by mouth daily.     Fish Oil 500 MG Caps  Take 500 mg by mouth daily.  HYDROcodone-acetaminophen 5-325 MG per tablet  Commonly known as:  NORCO/VICODIN  Take 1-2 tablets by mouth every 4 (four) hours as needed (breakthrough pain).     lisinopril 10 MG tablet  Commonly known as:  PRINIVIL,ZESTRIL  Take 10 mg by mouth daily.     methocarbamol 500 MG tablet  Commonly known as:  ROBAXIN  Take 1 tablet (500 mg total) by mouth every 6 (six) hours as needed for muscle spasms.     MULTI VITAMIN DAILY PO  Take 1 tablet by mouth daily.     simvastatin 20 MG tablet  Commonly known as:  ZOCOR  Take 20 mg by mouth daily at 6 PM.     VITAMIN C PO  Take 1 tablet by mouth daily.     VITAMIN D-3 PO  Take 1 tablet by mouth daily.     VITAMIN E PO  Take 1 tablet by mouth daily.        Diagnostic Studies: Dg Chest 2 View  10/27/2014   CLINICAL DATA:  Total hip arthroplasty  EXAM: CHEST  2 VIEW  COMPARISON:  None.  FINDINGS: Mild cardiomegaly. Low volumes. Normal vascularity. Left basilar nodular density. No effusion or pneumothorax. There is pleural thickening along the mid lateral hemithoraces that is nonspecific and likely chronic.  IMPRESSION: Left basilar nodular density.  CT chest is  recommended.   Electronically Signed   By: Marybelle Killings M.D.   On: 10/27/2014 10:53   Ct Chest W Contrast  11/09/2014   CLINICAL DATA:  Abnormal chest Xray. Pt was a smoker but quit back in 1993. Questionable left lower lung zone nodule on the recent prior chest radiograph.  EXAM: CT CHEST WITH CONTRAST  TECHNIQUE: Multidetector CT imaging of the chest was performed during intravenous contrast administration.  CONTRAST:  71mL OMNIPAQUE IOHEXOL 300 MG/ML  SOLN  COMPARISON:  10/27/2014  FINDINGS: Thoracic inlet:  Normal.  Mediastinum and hila: Heart normal in size and configuration. No coronary artery calcifications. Great vessels are normal in caliber. No atherosclerotic plaque. No mediastinal or hilar masses or pathologically enlarged lymph nodes.  Lungs and pleura: No lung mass or suspicious nodule. Small calcified granuloma in the right middle lobe. Minimal dependent subsegmental atelectasis. No lung consolidation or edema. No pleural effusion. The subtle opacity noted along the left heart border on the prior chest radiograph was likely due to prominent mediastinal fat. There is significant fat surrounding the heart and mediastinal structures as well as some more prominent subpleural fat along both hemithoraces.  Limited upper abdomen: Status post cholecystectomy. Hepatic steatosis. Otherwise unremarkable.  Musculoskeletal: Disc degenerative changes, relatively mild, throughout the visualized spine. No osteoblastic or osteolytic lesions  IMPRESSION: 1. No evidence of a lung mass or suspicious nodule. 2. No acute findings. 3. Small right middle lobe calcified granuloma. Hepatic steatosis. Status post cholecystectomy. Mild degenerative changes throughout the thoracic spine. No other abnormalities.   Electronically Signed   By: Lajean Manes M.D.   On: 11/09/2014 09:29   Dg Pelvis Portable  11/13/2014   CLINICAL DATA:  Post revision left total hip arthroplasty.  EXAM: PORTABLE PELVIS 1-2 VIEWS  COMPARISON:   11/13/2014  FINDINGS: Examination demonstrates revision of patient's left hip arthroplasty as the acetabular component is now normally located with associated screws into the superior acetabulum. Left femoral prosthesis is within normal. There are moderate degenerative changes of the right hip. Remainder the exam is unchanged.  IMPRESSION: Left total hip arthroplasty normally located and intact.  Electronically Signed   By: Marin Olp M.D.   On: 11/13/2014 21:27   Ct Hip Left Wo Contrast  11/13/2014   CLINICAL DATA:  Acute onset of left hip pain this morning. Total hip replacement on 11/08/2014.  EXAM: CT OF THE LEFT HIP WITHOUT CONTRAST  TECHNIQUE: Multidetector CT imaging of the left hip was performed according to the standard protocol. Multiplanar CT image reconstructions were also generated.  COMPARISON:  Images dated 11/13/2014 and 11/08/2014  FINDINGS: The acetabular shell of the left total hip prosthesis has migrated through the medial wall of the right acetabulum and is now rotated into different tilt and anteversion. There is a small amount of air in hemorrhage in around the left obturator internus muscle extends along the left side of the bladder. The medial wall of the acetabulum as disrupted. The femoral component is properly seated within the acetabular shell. The femoral stem appears normal.  IMPRESSION: Protrusion of the acetabular component of the left total hip prosthesis through the medial wall of the left acetabulum with slight hemorrhage in the left side of the pelvis. Acetabular component has changed tilt and anteversion.   Electronically Signed   By: Lorriane Shire M.D.   On: 11/13/2014 14:35   Dg C-arm 61-120 Min  11/13/2014   CLINICAL DATA:  Replacement of acetabular component of left hip arthroplasty.  EXAM: LEFT HIP (WITH PELVIS) 2-3 VIEWS; DG C-ARM 61-120 MIN  COMPARISON:  11/13/2014  FINDINGS: Examination demonstrates evidence of patient's replacement of the acetabular component  of the left total hip arthroplasty which is in normal location with 3 associated screws extending into the superior acetabulum. Remainder the exam is unchanged.  IMPRESSION: Replacement of the acetabular component of the left hip arthroplasty intact and normally located.   Electronically Signed   By: Marin Olp M.D.   On: 11/13/2014 18:41   Dg Hip Operative Unilat With Pelvis Left  11/08/2014   CLINICAL DATA:  Left total hip arthroplasty.  EXAM: OPERATIVE left HIP (WITH PELVIS IF PERFORMED) 1 VIEWS  TECHNIQUE: Fluoroscopic spot image(s) were submitted for interpretation post-operatively.  FLUOROSCOPY TIME:  Radiation Exposure Index (as provided by the fluoroscopic device):  If the device does not provide the exposure index:  Fluoroscopy Time:  50 second  Number of Acquired Images:  1  COMPARISON:  None.  FINDINGS: The patient is status post left total hip arthroplasty. The hardware components are in anatomic alignment and there are no complications. There is no periprosthetic fracture or dislocation identified.  IMPRESSION: 1. No complications after left hip arthroplasty.   Electronically Signed   By: Kerby Moors M.D.   On: 11/08/2014 13:46   Dg Hip Unilat With Pelvis 2-3 Views Left  11/13/2014   CLINICAL DATA:  Replacement of acetabular component of left hip arthroplasty.  EXAM: LEFT HIP (WITH PELVIS) 2-3 VIEWS; DG C-ARM 61-120 MIN  COMPARISON:  11/13/2014  FINDINGS: Examination demonstrates evidence of patient's replacement of the acetabular component of the left total hip arthroplasty which is in normal location with 3 associated screws extending into the superior acetabulum. Remainder the exam is unchanged.  IMPRESSION: Replacement of the acetabular component of the left hip arthroplasty intact and normally located.   Electronically Signed   By: Marin Olp M.D.   On: 11/13/2014 18:41   Dg Hip Unilat With Pelvis 2-3 Views Left  11/13/2014   CLINICAL DATA:  Left hip replacement performed on  Tuesday. Acute pain today.  EXAM: LEFT HIP (WITH PELVIS) 2-3  VIEWS  COMPARISON:  11/08/2014 postoperative study  FINDINGS: Left total hip arthroplasty identified.  There has been interval migration/protrusio of the acetabular component superiorly and medially with rotation anteriorly.  The femoral head component appears seated.  No other acute abnormalities are identified.  IMPRESSION: Interval migration/protrusio and rotation of the acetabular component of left hip replacement. Femoral head component appears seated.   Electronically Signed   By: Margarette Canada M.D.   On: 11/13/2014 12:29    Disposition: 06-Home-Health Care Svc      Discharge Instructions    Call MD / Call 911    Complete by:  As directed   If you experience chest pain or shortness of breath, CALL 911 and be transported to the hospital emergency room.  If you develope a fever above 101 F, pus (white drainage) or increased drainage or redness at the wound, or calf pain, call your surgeon's office.     Constipation Prevention    Complete by:  As directed   Drink plenty of fluids.  Prune juice may be helpful.  You may use a stool softener, such as Colace (over the counter) 100 mg twice a day.  Use MiraLax (over the counter) for constipation as needed.     Diet - low sodium heart healthy    Complete by:  As directed      Discharge instructions    Complete by:  As directed   INSTRUCTIONS AFTER JOINT REPLACEMENT   Remove items at home which could result in a fall. This includes throw rugs or furniture in walking pathways ICE to the affected joint every three hours while awake for 30 minutes at a time, for at least the first 3-5 days, and then as needed for pain and swelling.  Continue to use ice for pain and swelling. You may notice swelling that will progress down to the foot and ankle.  This is normal after surgery.  Elevate your leg when you are not up walking on it.   Continue to use the breathing machine you got in the hospital  (incentive spirometer) which will help keep your temperature down.  It is common for your temperature to cycle up and down following surgery, especially at night when you are not up moving around and exerting yourself.  The breathing machine keeps your lungs expanded and your temperature down.   DIET:  As you were doing prior to hospitalization, we recommend a well-balanced diet.  DRESSING / WOUND CARE / SHOWERING  Keep bandage clean and dry until follow up.  ACTIVITY  Increase activity slowly as tolerated, but follow the weight bearing instructions below.   No driving for 6 weeks or until further direction given by your physician.  You cannot drive while taking narcotics.  No lifting or carrying greater than 10 lbs. until further directed by your surgeon. Avoid periods of inactivity such as sitting longer than an hour when not asleep. This helps prevent blood clots.  You may return to work once you are authorized by your doctor.     WEIGHT BEARING   Other:  Touchdown weightbearing only on left leg for 6 weeks post op.  EXERCISES  Results after joint replacement surgery are often greatly improved when you follow the exercise, range of motion and muscle strengthening exercises prescribed by your doctor. Safety measures are also important to protect the joint from further injury. Any time any of these exercises cause you to have increased pain or swelling, decrease what you are doing  until you are comfortable again and then slowly increase them. If you have problems or questions, call your caregiver or physical therapist for advice.   Rehabilitation is important following a joint replacement. After just a few days of immobilization, the muscles of the leg can become weakened and shrink (atrophy).  These exercises are designed to build up the tone and strength of the thigh and leg muscles and to improve motion. Often times heat used for twenty to thirty minutes before working out will loosen  up your tissues and help with improving the range of motion but do not use heat for the first two weeks following surgery (sometimes heat can increase post-operative swelling).   These exercises can be done on a training (exercise) mat, on the floor, on a table or on a bed. Use whatever works the best and is most comfortable for you.    Use music or television while you are exercising so that the exercises are a pleasant break in your day. This will make your life better with the exercises acting as a break in your routine that you can look forward to.   Perform all exercises about fifteen times, three times per day or as directed.  You should exercise both the operative leg and the other leg as well.   Exercises include:   Quad Sets - Tighten up the muscle on the front of the thigh (Quad) and hold for 5-10 seconds.   Straight Leg Raises - With your knee straight (if you were given a brace, keep it on), lift the leg to 60 degrees, hold for 3 seconds, and slowly lower the leg.  Perform this exercise against resistance later as your leg gets stronger.  Leg Slides: Lying on your back, slowly slide your foot toward your buttocks, bending your knee up off the floor (only go as far as is comfortable). Then slowly slide your foot back down until your leg is flat on the floor again.  Angel Wings: Lying on your back spread your legs to the side as far apart as you can without causing discomfort.  Hamstring Strength:  Lying on your back, push your heel against the floor with your leg straight by tightening up the muscles of your buttocks.  Repeat, but this time bend your knee to a comfortable angle, and push your heel against the floor.  You may put a pillow under the heel to make it more comfortable if necessary.   A rehabilitation program following joint replacement surgery can speed recovery and prevent re-injury in the future due to weakened muscles. Contact your doctor or a physical therapist for more  information on knee rehabilitation.    CONSTIPATION  Constipation is defined medically as fewer than three stools per week and severe constipation as less than one stool per week.  Even if you have a regular bowel pattern at home, your normal regimen is likely to be disrupted due to multiple reasons following surgery.  Combination of anesthesia, postoperative narcotics, change in appetite and fluid intake all can affect your bowels.   YOU MUST use at least one of the following options; they are listed in order of increasing strength to get the job done.  They are all available over the counter, and you may need to use some, POSSIBLY even all of these options:    Drink plenty of fluids (prune juice may be helpful) and high fiber foods Colace 100 mg by mouth twice a day  Senokot for constipation as  directed and as needed Dulcolax (bisacodyl), take with full glass of water  Miralax (polyethylene glycol) once or twice a day as needed.  If you have tried all these things and are unable to have a bowel movement in the first 3-4 days after surgery call either your surgeon or your primary doctor.    If you experience loose stools or diarrhea, hold the medications until you stool forms back up.  If your symptoms do not get better within 1 week or if they get worse, check with your doctor.  If you experience "the worst abdominal pain ever" or develop nausea or vomiting, please contact the office immediately for further recommendations for treatment.   ITCHING:  If you experience itching with your medications, try taking only a single pain pill, or even half a pain pill at a time.  You can also use Benadryl over the counter for itching or also to help with sleep.   TED HOSE STOCKINGS:  Use stockings on both legs until for at least 2 weeks or as directed by physician office. They may be removed at night for sleeping.  MEDICATIONS:  See your medication summary on the "After Visit Summary" that nursing will  review with you.  You may have some home medications which will be placed on hold until you complete the course of blood thinner medication.  It is important for you to complete the blood thinner medication as prescribed.  PRECAUTIONS:  If you experience chest pain or shortness of breath - call 911 immediately for transfer to the hospital emergency department.   If you develop a fever greater that 101 F, purulent drainage from wound, increased redness or drainage from wound, foul odor from the wound/dressing, or calf pain - CONTACT YOUR SURGEON.                                                   FOLLOW-UP APPOINTMENTS:  If you do not already have a post-op appointment, please call the office for an appointment to be seen by your surgeon.  Guidelines for how soon to be seen are listed in your "After Visit Summary", but are typically between 1-4 weeks after surgery.  OTHER INSTRUCTIONS:   Knee Replacement:  Do not place pillow under knee, focus on keeping the knee straight while resting. CPM instructions: 0-90 degrees, 2 hours in the morning, 2 hours in the afternoon, and 2 hours in the evening. Place foam block, curve side up under heel at all times except when in CPM or when walking.  DO NOT modify, tear, cut, or change the foam block in any way.  MAKE SURE YOU:  Understand these instructions.  Get help right away if you are not doing well or get worse.    Thank you for letting us be a part of your medical care team.  It is a privilege we respect greatly.  We hope these instructions will help you stay on track for a fast and full recovery!     Increase activity slowly as tolerated    Complete by:  As directed            Follow-up Information    Follow up with Hessie Dibble, MD. Schedule an appointment as soon as possible for a visit in 2 weeks.   Specialty:  Orthopedic Surgery   Contact information:  [place] [address] [phone]        Signed: Rich Fuchs 11/16/2014, 1:17 PM

## 2014-11-16 NOTE — Progress Notes (Signed)
Report was call to Kathlee Nations at [facility].Pt. Is ready to transfer.

## 2014-11-17 ENCOUNTER — Encounter: Payer: Self-pay | Admitting: Adult Health

## 2014-11-17 ENCOUNTER — Non-Acute Institutional Stay (SKILLED_NURSING_FACILITY): Payer: 59 | Admitting: Adult Health

## 2014-11-17 DIAGNOSIS — E785 Hyperlipidemia, unspecified: Secondary | ICD-10-CM

## 2014-11-17 DIAGNOSIS — K5900 Constipation, unspecified: Secondary | ICD-10-CM

## 2014-11-17 DIAGNOSIS — I10 Essential (primary) hypertension: Secondary | ICD-10-CM | POA: Diagnosis not present

## 2014-11-17 DIAGNOSIS — D62 Acute posthemorrhagic anemia: Secondary | ICD-10-CM

## 2014-11-17 DIAGNOSIS — Z9660 Presence of unspecified orthopedic joint implant: Secondary | ICD-10-CM

## 2014-11-17 DIAGNOSIS — Z96649 Presence of unspecified artificial hip joint: Secondary | ICD-10-CM

## 2014-11-17 DIAGNOSIS — M1612 Unilateral primary osteoarthritis, left hip: Secondary | ICD-10-CM | POA: Diagnosis not present

## 2014-11-17 NOTE — Progress Notes (Signed)
Patient ID: [name], male   DOB: [birth year], 69 y.o.   MRN: [id number]   11/17/2014  Facility:  Nursing Home Location:  [place] Room Number: 902-P LEVEL OF CARE:  SNF (31)   Chief Complaint  Patient presents with  . Hospitalization Follow-up    Osteoarthritis S/P revision of left total hip arthroplasty, hypertension, hyperlipidemia and constipation    HISTORY OF PRESENT ILLNESS:  This is a 69 year old male who has been admitted to [facility] on 11/16/14 from [facility]. He has PMH of hypertension and erectile dysfunction and hyperlipidemia.  He has osteoarthritis and had a left total hip arthroplasty on 11/08/14. He was discharged home on 11/11/14. Patient was walking from the bathroom back to bed and felt a "pop" and was unable to bear full weight on the left leg and left upper extremity leg to knee has swelling. He was advised to go back to hospital.  He had revision of left total hip arthroplasty on 11/13/14.  Patient is complaining of constipation. He has no BM X 4 days.  He has been admitted for a short-term rehabilitation.  PAST MEDICAL HISTORY:  Past Medical History  Diagnosis Date  . Hyperlipidemia     takes Simvastatin daily  . Osteoarthritis     knee  . ED (erectile dysfunction)     takes Viagra as needed  . Hypertension     takes Lisinopril daily  . Joint pain   . History of bronchitis last time about 56yrs ago    CURRENT MEDICATIONS: Reviewed per MAR/see medication list  No Known Allergies   REVIEW OF SYSTEMS:  GENERAL: no change in appetite, no fatigue, no weight changes, no fever, chills or weakness RESPIRATORY: no cough, SOB, DOE, wheezing, hemoptysis CARDIAC: no chest pain, edema or palpitations GI: no abdominal pain, diarrhea, heart burn, nausea or vomiting, + constipation  PHYSICAL EXAMINATION  GENERAL: no acute distress, obese SKIN:  Left hip has Aquacel dressing, dry, no redness EYES: conjunctivae  normal, sclerae normal, normal eye lids NECK: supple, trachea midline, no neck masses, no thyroid tenderness, no thyromegaly LYMPHATICS: no LAN in the neck, no supraclavicular LAN RESPIRATORY: breathing is even & unlabored, BS CTAB CARDIAC: RRR, no murmur,no extra heart sounds, no edema GI: abdomen soft, normal BS, no masses, no tenderness, no hepatomegaly, no splenomegaly EXTREMITIES: He is able to move 4 extremities PSYCHIATRIC: the patient is alert & oriented to person, affect & behavior appropriate  LABS/RADIOLOGY: Labs reviewed: Basic Metabolic Panel:  Recent Labs  10/27/14 0953 11/09/14 0528 11/13/14 1335 11/14/14 0550  NA 139 133* 137 133*  K 4.1 4.2 4.5 4.4  CL 104 101 98* 99*  CO2 28 23  --  27  GLUCOSE 159* 121* 129* 148*  BUN 16 14 16 16   CREATININE 0.86 0.71 0.80 0.84  CALCIUM 9.3 8.7*  --  8.0*   CBC:  Recent Labs  10/27/14 0953  11/13/14 1319  11/14/14 0550 11/15/14 0505 11/16/14 0500  WBC 5.2  < > 7.8  --  7.7 10.6* 8.4  NEUTROABS 3.5  --  5.6  --   --   --   --   HGB 15.4  < > 11.5*  < > 9.5* 8.4* 8.4*  HCT 46.4  < > 34.4*  < > 28.5* 25.4* 25.3*  MCV 91.5  < > 90.3  --  91.1 90.7 90.0  PLT 167  < > 214  --  216 220 249  < > =  values in this interval not displayed.   Dg Chest 2 View  10/27/2014   CLINICAL DATA:  Total hip arthroplasty  EXAM: CHEST  2 VIEW  COMPARISON:  None.  FINDINGS: Mild cardiomegaly. Low volumes. Normal vascularity. Left basilar nodular density. No effusion or pneumothorax. There is pleural thickening along the mid lateral hemithoraces that is nonspecific and likely chronic.  IMPRESSION: Left basilar nodular density.  CT chest is recommended.   Electronically Signed   By: Marybelle Killings M.D.   On: 10/27/2014 10:53   Ct Chest W Contrast  11/09/2014   CLINICAL DATA:  Abnormal chest Xray. Pt was a smoker but quit back in 1993. Questionable left lower lung zone nodule on the recent prior chest radiograph.  EXAM: CT CHEST WITH CONTRAST   TECHNIQUE: Multidetector CT imaging of the chest was performed during intravenous contrast administration.  CONTRAST:  51mL OMNIPAQUE IOHEXOL 300 MG/ML  SOLN  COMPARISON:  10/27/2014  FINDINGS: Thoracic inlet:  Normal.  Mediastinum and hila: Heart normal in size and configuration. No coronary artery calcifications. Great vessels are normal in caliber. No atherosclerotic plaque. No mediastinal or hilar masses or pathologically enlarged lymph nodes.  Lungs and pleura: No lung mass or suspicious nodule. Small calcified granuloma in the right middle lobe. Minimal dependent subsegmental atelectasis. No lung consolidation or edema. No pleural effusion. The subtle opacity noted along the left heart border on the prior chest radiograph was likely due to prominent mediastinal fat. There is significant fat surrounding the heart and mediastinal structures as well as some more prominent subpleural fat along both hemithoraces.  Limited upper abdomen: Status post cholecystectomy. Hepatic steatosis. Otherwise unremarkable.  Musculoskeletal: Disc degenerative changes, relatively mild, throughout the visualized spine. No osteoblastic or osteolytic lesions  IMPRESSION: 1. No evidence of a lung mass or suspicious nodule. 2. No acute findings. 3. Small right middle lobe calcified granuloma. Hepatic steatosis. Status post cholecystectomy. Mild degenerative changes throughout the thoracic spine. No other abnormalities.   Electronically Signed   By: Lajean Manes M.D.   On: 11/09/2014 09:29   Dg Pelvis Portable  11/13/2014   CLINICAL DATA:  Post revision left total hip arthroplasty.  EXAM: PORTABLE PELVIS 1-2 VIEWS  COMPARISON:  11/13/2014  FINDINGS: Examination demonstrates revision of patient's left hip arthroplasty as the acetabular component is now normally located with associated screws into the superior acetabulum. Left femoral prosthesis is within normal. There are moderate degenerative changes of the right hip. Remainder the  exam is unchanged.  IMPRESSION: Left total hip arthroplasty normally located and intact.   Electronically Signed   By: Marin Olp M.D.   On: 11/13/2014 21:27   Ct Hip Left Wo Contrast  11/13/2014   CLINICAL DATA:  Acute onset of left hip pain this morning. Total hip replacement on 11/08/2014.  EXAM: CT OF THE LEFT HIP WITHOUT CONTRAST  TECHNIQUE: Multidetector CT imaging of the left hip was performed according to the standard protocol. Multiplanar CT image reconstructions were also generated.  COMPARISON:  Images dated 11/13/2014 and 11/08/2014  FINDINGS: The acetabular shell of the left total hip prosthesis has migrated through the medial wall of the right acetabulum and is now rotated into different tilt and anteversion. There is a small amount of air in hemorrhage in around the left obturator internus muscle extends along the left side of the bladder. The medial wall of the acetabulum as disrupted. The femoral component is properly seated within the acetabular shell. The femoral stem appears normal.  IMPRESSION:  Protrusion of the acetabular component of the left total hip prosthesis through the medial wall of the left acetabulum with slight hemorrhage in the left side of the pelvis. Acetabular component has changed tilt and anteversion.   Electronically Signed   By: Lorriane Shire M.D.   On: 11/13/2014 14:35   Dg C-arm 61-120 Min  11/13/2014   CLINICAL DATA:  Replacement of acetabular component of left hip arthroplasty.  EXAM: LEFT HIP (WITH PELVIS) 2-3 VIEWS; DG C-ARM 61-120 MIN  COMPARISON:  11/13/2014  FINDINGS: Examination demonstrates evidence of patient's replacement of the acetabular component of the left total hip arthroplasty which is in normal location with 3 associated screws extending into the superior acetabulum. Remainder the exam is unchanged.  IMPRESSION: Replacement of the acetabular component of the left hip arthroplasty intact and normally located.   Electronically Signed   By: Marin Olp M.D.   On: 11/13/2014 18:41   Dg Hip Operative Unilat With Pelvis Left  11/08/2014   CLINICAL DATA:  Left total hip arthroplasty.  EXAM: OPERATIVE left HIP (WITH PELVIS IF PERFORMED) 1 VIEWS  TECHNIQUE: Fluoroscopic spot image(s) were submitted for interpretation post-operatively.  FLUOROSCOPY TIME:  Radiation Exposure Index (as provided by the fluoroscopic device):  If the device does not provide the exposure index:  Fluoroscopy Time:  50 second  Number of Acquired Images:  1  COMPARISON:  None.  FINDINGS: The patient is status post left total hip arthroplasty. The hardware components are in anatomic alignment and there are no complications. There is no periprosthetic fracture or dislocation identified.  IMPRESSION: 1. No complications after left hip arthroplasty.   Electronically Signed   By: Kerby Moors M.D.   On: 11/08/2014 13:46   Dg Hip Unilat With Pelvis 2-3 Views Left  11/13/2014   CLINICAL DATA:  Replacement of acetabular component of left hip arthroplasty.  EXAM: LEFT HIP (WITH PELVIS) 2-3 VIEWS; DG C-ARM 61-120 MIN  COMPARISON:  11/13/2014  FINDINGS: Examination demonstrates evidence of patient's replacement of the acetabular component of the left total hip arthroplasty which is in normal location with 3 associated screws extending into the superior acetabulum. Remainder the exam is unchanged.  IMPRESSION: Replacement of the acetabular component of the left hip arthroplasty intact and normally located.   Electronically Signed   By: Marin Olp M.D.   On: 11/13/2014 18:41   Dg Hip Unilat With Pelvis 2-3 Views Left  11/13/2014   CLINICAL DATA:  Left hip replacement performed on Tuesday. Acute pain today.  EXAM: LEFT HIP (WITH PELVIS) 2-3 VIEWS  COMPARISON:  11/08/2014 postoperative study  FINDINGS: Left total hip arthroplasty identified.  There has been interval migration/protrusio of the acetabular component superiorly and medially with rotation anteriorly.  The femoral head component  appears seated.  No other acute abnormalities are identified.  IMPRESSION: Interval migration/protrusio and rotation of the acetabular component of left hip replacement. Femoral head component appears seated.   Electronically Signed   By: Margarette Canada M.D.   On: 11/13/2014 12:29    ASSESSMENT/PLAN:  Osteoarthritis S/P revision of left total hip arthroplasty - for rehabilitation; continue Robaxin 500 mg 1 tab by mouth every 6 hours when necessary for muscle spasm;  Norco 5/325 mg 1-2 tabs by mouth every 4 hours when necessary for pain; and aspirin 325 mg 1 tab by mouth twice a day for DVT prophylaxis; follow-up with Dr. Melrose Nakayama, orthosurgeon, in 2 weeks Hypertension - well controlled; continue lisinopril 10 mg by mouth daily  Hyperlipidemia - continue simvastatin 20 mg 1 tab by mouth daily Constipation - continue senna S2 tabs by mouth twice a day and MiraLAX 17 g +4-6 ounces liquid by mouth twice a day; start Fleet's enema per rectum daily 1 Anemia, acute blood loss - hgb 8.4; will monitor   Goals of care:  Short-term rehabilitation   Labs/test ordered:  CBC and CMP  Spent 50 minutes in patient care.     [facility], NP [employer] [phone]

## 2014-11-21 DIAGNOSIS — Z96642 Presence of left artificial hip joint: Secondary | ICD-10-CM | POA: Diagnosis not present

## 2014-11-22 ENCOUNTER — Non-Acute Institutional Stay (SKILLED_NURSING_FACILITY): Payer: 59 | Admitting: Internal Medicine

## 2014-11-22 DIAGNOSIS — D62 Acute posthemorrhagic anemia: Secondary | ICD-10-CM

## 2014-11-22 DIAGNOSIS — I10 Essential (primary) hypertension: Secondary | ICD-10-CM

## 2014-11-22 DIAGNOSIS — Z9660 Presence of unspecified orthopedic joint implant: Secondary | ICD-10-CM | POA: Diagnosis not present

## 2014-11-22 DIAGNOSIS — K5901 Slow transit constipation: Secondary | ICD-10-CM

## 2014-11-22 DIAGNOSIS — Z96649 Presence of unspecified artificial hip joint: Secondary | ICD-10-CM

## 2014-11-22 DIAGNOSIS — E785 Hyperlipidemia, unspecified: Secondary | ICD-10-CM | POA: Diagnosis not present

## 2014-11-22 NOTE — [facility] Notes (Signed)
Patient ID: [name], male   DOB: Nov 10, 1945, 69 y.o.   MRN: [id number]     [facility]   PCP: Mayra Neer, MD  Code Status: full code  No Known Allergies  Chief Complaint  Patient presents with  . New Admit To SNF     HPI:  69 year old patient is here for short term rehabilitation post hospital admission from 11/13/14-11/16/14 for revision of left total hip arthroplasty. His pain is under control. He is having loose stools post adjustment of bowel regimen as he was constipated on arrival to facility. His friend is present in the room. He denies any other concerns.  He has pmh of morbid obesity, HTN, HLD, erectile dysfunction  Review of Systems:  Constitutional: Negative for fever, chills, malaise/fatigue and diaphoresis.  HENT: Negative for headache, congestion, nasal discharge Eyes: Negative for eye pain, blurred vision, double vision and discharge.  Respiratory: Negative for cough, shortness of breath and wheezing.   Cardiovascular: Negative for chest pain, palpitations, leg swelling.  Gastrointestinal: Negative for heartburn, nausea, vomiting, abdominal pain. Had bowel movement yesterday. Appetite is good Genitourinary: Negative for dysuria Musculoskeletal: Negative for back pain, falls Skin: Negative for itching, rash.  Neurological: Negative for dizziness, tingling, focal weakness Psychiatric/Behavioral: Negative for depression    Past Medical History  Diagnosis Date  . Hyperlipidemia     takes Simvastatin daily  . Osteoarthritis     knee  . ED (erectile dysfunction)     takes Viagra as needed  . Hypertension     takes Lisinopril daily  . Joint pain   . History of bronchitis last time about 37yrs ago   Past Surgical History  Procedure Laterality Date  . Achiles tendon Left   . Shoulder surgery Right   . Knee arthroscopy Left   . Finger surgery      left pointer   . Cholecystectomy      with mesh  . Vein ligation  and stripping Left at age 64  . Wrist surgery Left at age 16  . Wisdom teeth extracted  1972  . Colonoscopy    . Steroid injection to scar      about 6 months ago  . Total hip arthroplasty Left 11/08/2014    Procedure: TOTAL HIP ARTHROPLASTY ANTERIOR APPROACH;  Surgeon: Melrose Nakayama, MD;  Location: [place];  Service: Orthopedics;  Laterality: Left;  . Revision total hip arthroplasty Left 11/13/2014  . Total hip arthroplasty Left 11/13/2014    Procedure: REVISION OF LEFT HIP ARTHROPLASTY ANTERIOR APPROACH;  Surgeon: Melrose Nakayama, MD;  Location: [place];  Service: Orthopedics;  Laterality: Left;   Social History:   reports that he has quit smoking. He has never used smokeless tobacco. He reports that he drinks alcohol. He reports that he does not use illicit drugs.  No family history on file.  Medications: Patient's Medications  New Prescriptions   No medications on file  Previous Medications   ASCORBIC ACID (VITAMIN C PO)    Take 1 tablet by mouth daily.   ASPIRIN EC 325 MG EC TABLET    Take 1 tablet (325 mg total) by mouth 2 (two) times daily after a meal.   CHOLECALCIFEROL (VITAMIN D-3 PO)    Take 1 tablet by mouth daily.    CYANOCOBALAMIN 1000 MCG TABLET    Take 1,000 mcg by mouth daily.    HYDROCODONE-ACETAMINOPHEN (NORCO/VICODIN) 5-325 MG PER TABLET    Take 1-2 tablets by mouth every  4 (four) hours as needed (breakthrough pain).   LISINOPRIL (PRINIVIL,ZESTRIL) 10 MG TABLET    Take 10 mg by mouth daily.   METHOCARBAMOL (ROBAXIN) 500 MG TABLET    Take 1 tablet (500 mg total) by mouth every 6 (six) hours as needed for muscle spasms.   MULTIPLE VITAMIN (MULTI VITAMIN DAILY PO)    Take 1 tablet by mouth daily.    OMEGA-3 FATTY ACIDS (FISH OIL) 500 MG CAPS    Take 500 mg by mouth daily.    SIMVASTATIN (ZOCOR) 20 MG TABLET    Take 20 mg by mouth daily at 6 PM.    VITAMIN E PO    Take 1 tablet by mouth daily.  Modified Medications   No medications on file  Discontinued Medications   No  medications on file     Physical Exam: Filed Vitals:   11/22/14 0807  BP: 154/82  Pulse: 78  Temp: 98.2 F (36.8 C)  Resp: 19  Weight: 295 lb (133.811 kg)  SpO2: 98%    General- elderly male, obese, in no acute distress Head- normocephalic, atraumatic Throat- moist mucus membrane Eyes- PERRLA, EOMI, no pallor, no icterus, no discharge, normal conjunctiva, normal sclera Neck- no cervical lymphadenopathy Cardiovascular- normal s1,s2, no murmurs, palpable dorsalis pedis, trace bilateral leg edema Respiratory- bilateral clear to auscultation, no wheeze, no rhonchi, no crackles, no use of accessory muscles Abdomen- bowel sounds present, soft, non tender Musculoskeletal- able to move all 4 extremities, generalized weakness, limited ROM with left hip Neurological- no focal deficit Skin- warm and dry, left hip dressing removed, mild erythema around incision line with incision healing well, no drainage, new dressing applied by the nurse Psychiatry- alert and oriented to person, place and time, normal mood and affect    Labs reviewed: Basic Metabolic Panel:  Recent Labs  10/27/14 0953 11/09/14 0528 11/13/14 1335 11/14/14 0550  NA 139 133* 137 133*  K 4.1 4.2 4.5 4.4  CL 104 101 98* 99*  CO2 28 23  --  27  GLUCOSE 159* 121* 129* 148*  BUN 16 14 16 16   CREATININE 0.86 0.71 0.80 0.84  CALCIUM 9.3 8.7*  --  8.0*    CBC:  Recent Labs  10/27/14 0953  11/13/14 1319  11/14/14 0550 11/15/14 0505 11/16/14 0500  WBC 5.2  < > 7.8  --  7.7 10.6* 8.4  NEUTROABS 3.5  --  5.6  --   --   --   --   HGB 15.4  < > 11.5*  < > 9.5* 8.4* 8.4*  HCT 46.4  < > 34.4*  < > 28.5* 25.4* 25.3*  MCV 91.5  < > 90.3  --  91.1 90.7 90.0  PLT 167  < > 214  --  216 220 249  < > = values in this interval not displayed.   Assessment/Plan  Osteoarthritis  S/P revision of left total hip arthroplasty. Continue Robaxin 500 mg q6 h prn muscle spasm, Norco 5/325 mg 1-2 tabs q4 h prn pain. Continue  aspirin 325 mg bid for dvt prophylaxis. Has follow up with orthopedics. Continue vitamin d supplement  Constipation Improved, infact has loose stools. Change senna s to 1 tab daily and change miralax to qd prn only. reassess  Post op blood loss anemia Hb 8.4, recheck cbc  Hypertension Stable. continue lisinopril 10 mg daily  Hyperlipidemia continue simvastatin 20 mg daily and fish oil   Goals of care: short term rehabilitation   Labs/tests ordered:  cbc, bmp  Family/ staff Communication: reviewed care plan with patient and nursing supervisor    Blanchie Serve, MD  [facility] [phone] (Monday-Friday 8 am - 5 pm) [phone] (afterhours)

## 2014-11-24 ENCOUNTER — Encounter: Payer: Self-pay | Admitting: Adult Health

## 2014-11-24 ENCOUNTER — Non-Acute Institutional Stay (SKILLED_NURSING_FACILITY): Payer: 59 | Admitting: Adult Health

## 2014-11-24 DIAGNOSIS — I10 Essential (primary) hypertension: Secondary | ICD-10-CM | POA: Diagnosis not present

## 2014-11-24 DIAGNOSIS — Z96649 Presence of unspecified artificial hip joint: Secondary | ICD-10-CM

## 2014-11-24 DIAGNOSIS — Z9660 Presence of unspecified orthopedic joint implant: Secondary | ICD-10-CM

## 2014-11-24 DIAGNOSIS — K5900 Constipation, unspecified: Secondary | ICD-10-CM

## 2014-11-24 DIAGNOSIS — D62 Acute posthemorrhagic anemia: Secondary | ICD-10-CM

## 2014-11-24 DIAGNOSIS — E785 Hyperlipidemia, unspecified: Secondary | ICD-10-CM

## 2014-11-24 DIAGNOSIS — M1612 Unilateral primary osteoarthritis, left hip: Secondary | ICD-10-CM

## 2014-11-24 NOTE — Progress Notes (Signed)
Patient ID: [name], male   DOB: Dec 13, 1945, 70 y.o.   MRN: [id number]   11/24/2014  Facility:  [facility] Home Location:  [facility] Room Number: 902-P LEVEL OF CARE:  SNF (31)   Chief Complaint  Patient presents with  . Discharge Note    Osteoarthritis S/P revision of left total hip arthroplasty, hypertension, hyperlipidemia and constipation    HISTORY OF PRESENT ILLNESS:  This is a 69 year old male who is for discharge home with Home health PT for endurance and OT for ADLs. He has been admitted to [facility] on 11/16/14 from [facility]. He has PMH of hypertension and erectile dysfunction and hyperlipidemia.  He has osteoarthritis and had a left total hip arthroplasty on 11/08/14. He was discharged home on 11/11/14. Patient was walking from the bathroom back to bed and felt a "pop" and was unable to bear full weight on the left leg and left upper extremity leg to knee has swelling. He was advised to go back to hospital.  He had revision of left total hip arthroplasty on 11/13/14.  Patient was admitted to this facility for short-term rehabilitation after the patient's recent hospitalization.  Patient has completed SNF rehabilitation and therapy has cleared the patient for discharge.   PAST MEDICAL HISTORY:  Past Medical History  Diagnosis Date  . Hyperlipidemia     takes Simvastatin daily  . Osteoarthritis     knee  . ED (erectile dysfunction)     takes Viagra as needed  . Hypertension     takes Lisinopril daily  . Joint pain   . History of bronchitis last time about 32yrs ago    CURRENT MEDICATIONS: Reviewed per MAR/see medication list  No Known Allergies   REVIEW OF SYSTEMS:  GENERAL: no change in appetite, no fatigue, no weight changes, no fever, chills or weakness RESPIRATORY: no cough, SOB, DOE, wheezing, hemoptysis CARDIAC: no chest pain, edema or palpitations GI: no abdominal pain, diarrhea, heart burn, nausea or  vomiting  PHYSICAL EXAMINATION  GENERAL: no acute distress, obese SKIN:  Left hip surgical site is dry, no redness NECK: supple, trachea midline, no neck masses, no thyroid tenderness, no thyromegaly LYMPHATICS: no LAN in the neck, no supraclavicular LAN RESPIRATORY: breathing is even & unlabored, BS CTAB CARDIAC: RRR, no murmur,no extra heart sounds, edema GI: abdomen soft, normal BS, no masses, no tenderness, no hepatomegaly, no splenomegaly EXTREMITIES: He is able to move 4 extremities PSYCHIATRIC: the patient is alert & oriented to person, affect & behavior appropriate  LABS/RADIOLOGY: Labs reviewed: Basic Metabolic Panel:  Recent Labs  10/27/14 0953 11/09/14 0528 11/13/14 1335 11/14/14 0550  NA 139 133* 137 133*  K 4.1 4.2 4.5 4.4  CL 104 101 98* 99*  CO2 28 23  --  27  GLUCOSE 159* 121* 129* 148*  BUN 16 14 16 16   CREATININE 0.86 0.71 0.80 0.84  CALCIUM 9.3 8.7*  --  8.0*   CBC:  Recent Labs  10/27/14 0953  11/13/14 1319  11/14/14 0550 11/15/14 0505 11/16/14 0500  WBC 5.2  < > 7.8  --  7.7 10.6* 8.4  NEUTROABS 3.5  --  5.6  --   --   --   --   HGB 15.4  < > 11.5*  < > 9.5* 8.4* 8.4*  HCT 46.4  < > 34.4*  < > 28.5* 25.4* 25.3*  MCV 91.5  < > 90.3  --  91.1 90.7 90.0  PLT 167  < > 214  --  216 220 249  < > = values in this interval not displayed.   Dg Chest 2 View  10/27/2014   CLINICAL DATA:  Total hip arthroplasty  EXAM: CHEST  2 VIEW  COMPARISON:  None.  FINDINGS: Mild cardiomegaly. Low volumes. Normal vascularity. Left basilar nodular density. No effusion or pneumothorax. There is pleural thickening along the mid lateral hemithoraces that is nonspecific and likely chronic.  IMPRESSION: Left basilar nodular density.  CT chest is recommended.   Electronically Signed   By: Marybelle Killings M.D.   On: 10/27/2014 10:53   Ct Chest W Contrast  11/09/2014   CLINICAL DATA:  Abnormal chest Xray. Pt was a smoker but quit back in 1993. Questionable left lower lung zone  nodule on the recent prior chest radiograph.  EXAM: CT CHEST WITH CONTRAST  TECHNIQUE: Multidetector CT imaging of the chest was performed during intravenous contrast administration.  CONTRAST:  76mL OMNIPAQUE IOHEXOL 300 MG/ML  SOLN  COMPARISON:  10/27/2014  FINDINGS: Thoracic inlet:  Normal.  Mediastinum and hila: Heart normal in size and configuration. No coronary artery calcifications. Great vessels are normal in caliber. No atherosclerotic plaque. No mediastinal or hilar masses or pathologically enlarged lymph nodes.  Lungs and pleura: No lung mass or suspicious nodule. Small calcified granuloma in the right middle lobe. Minimal dependent subsegmental atelectasis. No lung consolidation or edema. No pleural effusion. The subtle opacity noted along the left heart border on the prior chest radiograph was likely due to prominent mediastinal fat. There is significant fat surrounding the heart and mediastinal structures as well as some more prominent subpleural fat along both hemithoraces.  Limited upper abdomen: Status post cholecystectomy. Hepatic steatosis. Otherwise unremarkable.  Musculoskeletal: Disc degenerative changes, relatively mild, throughout the visualized spine. No osteoblastic or osteolytic lesions  IMPRESSION: 1. No evidence of a lung mass or suspicious nodule. 2. No acute findings. 3. Small right middle lobe calcified granuloma. Hepatic steatosis. Status post cholecystectomy. Mild degenerative changes throughout the thoracic spine. No other abnormalities.   Electronically Signed   By: Lajean Manes M.D.   On: 11/09/2014 09:29   Dg Pelvis Portable  11/13/2014   CLINICAL DATA:  Post revision left total hip arthroplasty.  EXAM: PORTABLE PELVIS 1-2 VIEWS  COMPARISON:  11/13/2014  FINDINGS: Examination demonstrates revision of patient's left hip arthroplasty as the acetabular component is now normally located with associated screws into the superior acetabulum. Left femoral prosthesis is within  normal. There are moderate degenerative changes of the right hip. Remainder the exam is unchanged.  IMPRESSION: Left total hip arthroplasty normally located and intact.   Electronically Signed   By: Marin Olp M.D.   On: 11/13/2014 21:27   Ct Hip Left Wo Contrast  11/13/2014   CLINICAL DATA:  Acute onset of left hip pain this morning. Total hip replacement on 11/08/2014.  EXAM: CT OF THE LEFT HIP WITHOUT CONTRAST  TECHNIQUE: Multidetector CT imaging of the left hip was performed according to the standard protocol. Multiplanar CT image reconstructions were also generated.  COMPARISON:  Images dated 11/13/2014 and 11/08/2014  FINDINGS: The acetabular shell of the left total hip prosthesis has migrated through the medial wall of the right acetabulum and is now rotated into different tilt and anteversion. There is a small amount of air in hemorrhage in around the left obturator internus muscle extends along the left side of the bladder. The medial wall of the acetabulum as disrupted. The  femoral component is properly seated within the acetabular shell. The femoral stem appears normal.  IMPRESSION: Protrusion of the acetabular component of the left total hip prosthesis through the medial wall of the left acetabulum with slight hemorrhage in the left side of the pelvis. Acetabular component has changed tilt and anteversion.   Electronically Signed   By: Lorriane Shire M.D.   On: 11/13/2014 14:35   Dg C-arm 61-120 Min  11/13/2014   CLINICAL DATA:  Replacement of acetabular component of left hip arthroplasty.  EXAM: LEFT HIP (WITH PELVIS) 2-3 VIEWS; DG C-ARM 61-120 MIN  COMPARISON:  11/13/2014  FINDINGS: Examination demonstrates evidence of patient's replacement of the acetabular component of the left total hip arthroplasty which is in normal location with 3 associated screws extending into the superior acetabulum. Remainder the exam is unchanged.  IMPRESSION: Replacement of the acetabular component of the left hip  arthroplasty intact and normally located.   Electronically Signed   By: Marin Olp M.D.   On: 11/13/2014 18:41   Dg Hip Operative Unilat With Pelvis Left  11/08/2014   CLINICAL DATA:  Left total hip arthroplasty.  EXAM: OPERATIVE left HIP (WITH PELVIS IF PERFORMED) 1 VIEWS  TECHNIQUE: Fluoroscopic spot image(s) were submitted for interpretation post-operatively.  FLUOROSCOPY TIME:  Radiation Exposure Index (as provided by the fluoroscopic device):  If the device does not provide the exposure index:  Fluoroscopy Time:  50 second  Number of Acquired Images:  1  COMPARISON:  None.  FINDINGS: The patient is status post left total hip arthroplasty. The hardware components are in anatomic alignment and there are no complications. There is no periprosthetic fracture or dislocation identified.  IMPRESSION: 1. No complications after left hip arthroplasty.   Electronically Signed   By: Kerby Moors M.D.   On: 11/08/2014 13:46   Dg Hip Unilat With Pelvis 2-3 Views Left  11/13/2014   CLINICAL DATA:  Replacement of acetabular component of left hip arthroplasty.  EXAM: LEFT HIP (WITH PELVIS) 2-3 VIEWS; DG C-ARM 61-120 MIN  COMPARISON:  11/13/2014  FINDINGS: Examination demonstrates evidence of patient's replacement of the acetabular component of the left total hip arthroplasty which is in normal location with 3 associated screws extending into the superior acetabulum. Remainder the exam is unchanged.  IMPRESSION: Replacement of the acetabular component of the left hip arthroplasty intact and normally located.   Electronically Signed   By: Marin Olp M.D.   On: 11/13/2014 18:41   Dg Hip Unilat With Pelvis 2-3 Views Left  11/13/2014   CLINICAL DATA:  Left hip replacement performed on Tuesday. Acute pain today.  EXAM: LEFT HIP (WITH PELVIS) 2-3 VIEWS  COMPARISON:  11/08/2014 postoperative study  FINDINGS: Left total hip arthroplasty identified.  There has been interval migration/protrusio of the acetabular component  superiorly and medially with rotation anteriorly.  The femoral head component appears seated.  No other acute abnormalities are identified.  IMPRESSION: Interval migration/protrusio and rotation of the acetabular component of left hip replacement. Femoral head component appears seated.   Electronically Signed   By: Margarette Canada M.D.   On: 11/13/2014 12:29    ASSESSMENT/PLAN:  Osteoarthritis S/P revision of left total hip arthroplasty - for rehabilitation; continue Robaxin 500 mg 1 tab by mouth every 6 hours when necessary for muscle spasm;  Norco 5/325 mg 1-2 tabs by mouth every 4 hours when necessary for pain; and aspirin 325 mg 1 tab by mouth twice a day for DVT prophylaxis; follow-up with Dr. Collier Salina  Dalldorf, orthosurgeon, in 2 weeks Hypertension - well controlled; continue lisinopril 10 mg by mouth daily Hyperlipidemia - continue simvastatin 20 mg 1 tab by mouth daily Constipation - continue senna S 1tabs by mouth twice a day and MiraLAX 17 g +4-6 ounces liquid by mouth Q D PRN Anemia, acute blood loss - hgb 8.4; for  Repeat cbc in am    I have filled out patient's discharge paperwork and written prescriptions.  Patient will receive home health PT and OT.  Total discharge time: Less than 30 minutes  Discharge time involved coordination of the discharge process with Education officer, museum, nursing staff and therapy department. Medical justification for home health services verified.    [facility], NP [employer] [phone]

## 2014-12-26 ENCOUNTER — Other Ambulatory Visit: Payer: Self-pay

## 2015-01-21 ENCOUNTER — Emergency Department (HOSPITAL_COMMUNITY)
Admission: EM | Admit: 2015-01-21 | Discharge: 2015-01-21 | Disposition: A | Discharge status: Home / Self Care | Payer: 59 | Attending: Emergency Medicine | Admitting: Emergency Medicine

## 2015-01-21 ENCOUNTER — Encounter (HOSPITAL_COMMUNITY): Payer: Self-pay | Admitting: Nurse Practitioner

## 2015-01-21 ENCOUNTER — Emergency Department (HOSPITAL_COMMUNITY): Payer: 59

## 2015-01-21 DIAGNOSIS — M1990 Unspecified osteoarthritis, unspecified site: Secondary | ICD-10-CM | POA: Diagnosis not present

## 2015-01-21 DIAGNOSIS — Z87891 Personal history of nicotine dependence: Secondary | ICD-10-CM | POA: Diagnosis not present

## 2015-01-21 DIAGNOSIS — E785 Hyperlipidemia, unspecified: Secondary | ICD-10-CM | POA: Diagnosis not present

## 2015-01-21 DIAGNOSIS — N5089 Other specified disorders of the male genital organs: Secondary | ICD-10-CM

## 2015-01-21 DIAGNOSIS — Z7982 Long term (current) use of aspirin: Secondary | ICD-10-CM | POA: Diagnosis not present

## 2015-01-21 DIAGNOSIS — I10 Essential (primary) hypertension: Secondary | ICD-10-CM | POA: Insufficient documentation

## 2015-01-21 DIAGNOSIS — Z79899 Other long term (current) drug therapy: Secondary | ICD-10-CM | POA: Insufficient documentation

## 2015-01-21 DIAGNOSIS — N508 Other specified disorders of male genital organs: Secondary | ICD-10-CM | POA: Diagnosis not present

## 2015-01-21 DIAGNOSIS — N451 Epididymitis: Secondary | ICD-10-CM | POA: Diagnosis not present

## 2015-01-21 LAB — CBC WITH DIFFERENTIAL/PLATELET
BASOS ABS: 0 10*3/uL (ref 0.0–0.1)
Basophils Relative: 0 % (ref 0–1)
EOS ABS: 0.1 10*3/uL (ref 0.0–0.7)
EOS PCT: 1 % (ref 0–5)
HCT: 40.8 % (ref 39.0–52.0)
Hemoglobin: 13.3 g/dL (ref 13.0–17.0)
Lymphocytes Relative: 11 % — ABNORMAL LOW (ref 12–46)
Lymphs Abs: 1.1 10*3/uL (ref 0.7–4.0)
MCH: 28.7 pg (ref 26.0–34.0)
MCHC: 32.6 g/dL (ref 30.0–36.0)
MCV: 88.1 fL (ref 78.0–100.0)
Monocytes Absolute: 0.9 10*3/uL (ref 0.1–1.0)
Monocytes Relative: 8 % (ref 3–12)
Neutro Abs: 8.2 10*3/uL — ABNORMAL HIGH (ref 1.7–7.7)
Neutrophils Relative %: 80 % — ABNORMAL HIGH (ref 43–77)
Platelets: 186 10*3/uL (ref 150–400)
RBC: 4.63 MIL/uL (ref 4.22–5.81)
RDW: 14.5 % (ref 11.5–15.5)
WBC: 10.3 10*3/uL (ref 4.0–10.5)

## 2015-01-21 LAB — BASIC METABOLIC PANEL
Anion gap: 8 (ref 5–15)
BUN: 18 mg/dL (ref 6–20)
CO2: 27 mmol/L (ref 22–32)
Calcium: 9.3 mg/dL (ref 8.9–10.3)
Chloride: 102 mmol/L (ref 101–111)
Creatinine, Ser: 0.82 mg/dL (ref 0.61–1.24)
GLUCOSE: 155 mg/dL — AB (ref 65–99)
POTASSIUM: 4 mmol/L (ref 3.5–5.1)
Sodium: 137 mmol/L (ref 135–145)

## 2015-01-21 LAB — URINALYSIS, ROUTINE W REFLEX MICROSCOPIC
Bilirubin Urine: NEGATIVE
Glucose, UA: 100 mg/dL — AB
HGB URINE DIPSTICK: NEGATIVE
Ketones, ur: 15 mg/dL — AB
Nitrite: NEGATIVE
Protein, ur: NEGATIVE mg/dL
SPECIFIC GRAVITY, URINE: 1.025 (ref 1.005–1.030)
Urobilinogen, UA: 0.2 mg/dL (ref 0.0–1.0)
pH: 5.5 (ref 5.0–8.0)

## 2015-01-21 LAB — URINE MICROSCOPIC-ADD ON

## 2015-01-21 MED ORDER — HYDROCODONE-ACETAMINOPHEN 5-325 MG PO TABS: 1.0000 | ORAL_TABLET | ORAL | Status: DC | PRN

## 2015-01-21 MED ORDER — CIPROFLOXACIN HCL 500 MG PO TABS: 500.0000 mg | ORAL_TABLET | Freq: Two times a day (BID) | ORAL | Status: DC

## 2015-01-21 MED ORDER — CIPROFLOXACIN HCL 500 MG PO TABS
500.0000 mg | ORAL_TABLET | Freq: Once | ORAL | Status: AC
Start: 2015-01-21 — End: 2015-01-21
  Administered 2015-01-21: 500 mg via ORAL
  Filled 2015-01-21: qty 1

## 2015-01-21 NOTE — ED Notes (Deleted)
dog bit him on L posterior calf this am, avulsion noted with no active bleeding now. Cms intact distal to injury. Animal control was not contacted, it was a family members dog

## 2015-01-21 NOTE — Discharge Instructions (Signed)
Please read and follow all provided instructions.  Your diagnoses today include:  1. Acute epididymitis   2. Scrotal swelling    Tests performed today include:  Blood counts - normal  Ultrasound - shows signs of epididymitis  Vital signs. See below for your results today.   Medications prescribed:   Vicodin (hydrocodone/acetaminophen) - narcotic pain medication  DO NOT drive or perform any activities that require you to be awake and alert because this medicine can make you drowsy. BE VERY CAREFUL not to take multiple medicines containing Tylenol (also called acetaminophen). Doing so can lead to an overdose which can damage your liver and cause liver failure and possibly death.   Ciprofloxacin - antibiotic  You have been prescribed an antibiotic medicine: take the entire course of medicine even if you are feeling better. Stopping early can cause the antibiotic not to work.  Take any prescribed medications only as directed.  Home care instructions:  Follow any educational materials contained in this packet.  BE VERY CAREFUL not to take multiple medicines containing Tylenol (also called acetaminophen). Doing so can lead to an overdose which can damage your liver and cause liver failure and possibly death.   Follow-up instructions: Please follow-up with the urologist listed in the next 7 days for further evaluation of your symptoms.   Return instructions:   Please return to the Emergency Department if you experience worsening symptoms.   Return with fever, vomiting.   Please return if you have any other emergent concerns.  Additional Information:  Your vital signs today were: BP 130/76 mmHg   Pulse 80   Temp(Src) 98.3 F (36.8 C) (Oral)   Resp 22   SpO2 96% If your blood pressure (BP) was elevated above 135/85 this visit, please have this repeated by your doctor within one month. --------------

## 2015-01-21 NOTE — ED Notes (Signed)
Patient transported to Ultrasound 

## 2015-01-21 NOTE — ED Provider Notes (Signed)
CSN: [id number]     Arrival date & time 01/21/15  1242 History   First MD Initiated Contact with Patient 01/21/15 1309     Chief Complaint  Patient presents with  . Testicle Pain    (Consider location/radiation/quality/duration/timing/severity/associated sxs/prior Treatment) HPI Comments: Patient presents with complaints of left testicular swelling that has been gradually worsening over the past 3 days. Patient also has noted that his scrotum is become red. He has never had symptoms like this in the past. He denies history of prostate problems including increased frequency, nocturia, weak stream. Denies injury. No fevers, nausea or vomiting. No dysuria, hematuria or urgency. Patient went to his primary care physician this morning and was referred to the emergency department for further evaluation. The onset of this condition was acute. Aggravating factors: palpation. Alleviating factors: none.    The history is provided by the patient.    Past Medical History  Diagnosis Date  . Hyperlipidemia     takes Simvastatin daily  . Osteoarthritis     knee  . ED (erectile dysfunction)     takes Viagra as needed  . Hypertension     takes Lisinopril daily  . Joint pain   . History of bronchitis last time about 48yrs ago   Past Surgical History  Procedure Laterality Date  . Achiles tendon Left   . Shoulder surgery Right   . Knee arthroscopy Left   . Finger surgery      left pointer   . Cholecystectomy      with mesh  . Vein ligation and stripping Left at age 54  . Wrist surgery Left at age 60  . Wisdom teeth extracted  1972  . Colonoscopy    . Steroid injection to scar      about 6 months ago  . Total hip arthroplasty Left 11/08/2014    Procedure: TOTAL HIP ARTHROPLASTY ANTERIOR APPROACH;  Surgeon: Melrose Nakayama, MD;  Location: [facility];  Service: Orthopedics;  Laterality: Left;  . Revision total hip arthroplasty Left 11/13/2014  . Total hip arthroplasty Left 11/13/2014    Procedure:  REVISION OF LEFT HIP ARTHROPLASTY ANTERIOR APPROACH;  Surgeon: Melrose Nakayama, MD;  Location: [facility];  Service: Orthopedics;  Laterality: Left;   History reviewed. No pertinent family history. History  Substance Use Topics  . Smoking status: Former Research scientist (life sciences)  . Smokeless tobacco: Never Used     Comment: quit smoking in 1993  . Alcohol Use: Yes     Comment: rarely    Review of Systems  Constitutional: Negative for fever.  HENT: Negative for rhinorrhea and sore throat.   Eyes: Negative for redness.  Respiratory: Negative for cough.   Cardiovascular: Negative for chest pain.  Gastrointestinal: Negative for nausea, vomiting, abdominal pain and diarrhea.  Genitourinary: Positive for scrotal swelling and testicular pain. Negative for dysuria, urgency, frequency, hematuria, flank pain, decreased urine volume, discharge, penile swelling, enuresis, difficulty urinating, genital sores and penile pain.  Musculoskeletal: Negative for myalgias.  Skin: Negative for rash.  Neurological: Negative for headaches.    Allergies  Review of patient's allergies indicates no known allergies.  Home Medications   Prior to Admission medications   Medication Sig Start Date End Date Taking? Authorizing Provider  Ascorbic Acid (VITAMIN C PO) Take 1 tablet by mouth daily.    Historical Provider, MD  aspirin EC 325 MG EC tablet Take 1 tablet (325 mg total) by mouth 2 (two) times daily after a meal. 11/11/14   Loni Dolly, PA-C  Cholecalciferol (VITAMIN D-3 PO) Take 1 tablet by mouth daily.     Historical Provider, MD  cyanocobalamin 1000 MCG tablet Take 1,000 mcg by mouth daily.     Historical Provider, MD  HYDROcodone-acetaminophen (NORCO/VICODIN) 5-325 MG per tablet Take 1-2 tablets by mouth every 4 (four) hours as needed (breakthrough pain). 11/11/14   Loni Dolly, PA-C  lisinopril (PRINIVIL,ZESTRIL) 10 MG tablet Take 10 mg by mouth daily.    Historical Provider, MD  methocarbamol (ROBAXIN) 500 MG tablet Take 1  tablet (500 mg total) by mouth every 6 (six) hours as needed for muscle spasms. 11/11/14   Loni Dolly, PA-C  Multiple Vitamin (MULTI VITAMIN DAILY PO) Take 1 tablet by mouth daily.     Historical Provider, MD  Omega-3 Fatty Acids (FISH OIL) 500 MG CAPS Take 500 mg by mouth daily.     Historical Provider, MD  simvastatin (ZOCOR) 20 MG tablet Take 20 mg by mouth daily at 6 PM.     Historical Provider, MD  VITAMIN E PO Take 1 tablet by mouth daily.    Historical Provider, MD   BP 126/73 mmHg  Pulse 98  Temp(Src) 98.3 F (36.8 C) (Oral)  Resp 22  SpO2 96%   Physical Exam  Constitutional: He appears well-developed and well-nourished.  HENT:  Head: Normocephalic and atraumatic.  Eyes: Conjunctivae are normal. Right eye exhibits no discharge. Left eye exhibits no discharge.  Neck: Normal range of motion. Neck supple.  Cardiovascular: Normal rate, regular rhythm and normal heart sounds.   Pulmonary/Chest: Effort normal and breath sounds normal.  Abdominal: Soft. There is no tenderness.  Genitourinary: Penis normal. Right testis shows tenderness. Right testis shows no mass and no swelling. Left testis shows swelling and tenderness. Left testis shows no mass.  Mild overlying erythema confined to scrotum. No palpable cutaneous abscess.   Lymphadenopathy:       Right: No inguinal adenopathy present.       Left: No inguinal adenopathy present.  Neurological: He is alert.  Skin: Skin is warm and dry.  Psychiatric: He has a normal mood and affect.  Nursing note and vitals reviewed.   ED Course  Procedures (including critical care time) Labs Review Labs Reviewed  CBC WITH DIFFERENTIAL/PLATELET - Abnormal; Notable for the following:    Neutrophils Relative % 80 (*)    Neutro Abs 8.2 (*)    Lymphocytes Relative 11 (*)    All other components within normal limits  BASIC METABOLIC PANEL - Abnormal; Notable for the following:    Glucose, Bld 155 (*)    All other components within normal limits   URINALYSIS, ROUTINE W REFLEX MICROSCOPIC (NOT AT [facility]) - Abnormal; Notable for the following:    Color, Urine AMBER (*)    APPearance CLOUDY (*)    Glucose, UA 100 (*)    Ketones, ur 15 (*)    Leukocytes, UA SMALL (*)    All other components within normal limits  URINE MICROSCOPIC-ADD ON    Imaging Review US Scrotum  01/21/2015   CLINICAL DATA:  Left testicular swelling, scrotal erythema  EXAM: SCROTAL ULTRASOUND  DOPPLER ULTRASOUND OF THE TESTICLES  TECHNIQUE: Complete ultrasound examination of the testicles, epididymis, and other scrotal structures was performed. Color and spectral Doppler ultrasound were also utilized to evaluate blood flow to the testicles.  COMPARISON:  No similar prior exam is available at this institution for comparison or on [facility] PACS.  FINDINGS: Right testicle  Measurements: 4.7 x 2.7 x 2.7 cm.  No mass or microlithiasis visualized.  Left testicle  Measurements: 4.4 x 3.8 x 3.5 cm. No mass or microlithiasis. There is diffusely increased vascularity throughout the left testis.  Right epididymis:  Normal in size and appearance.  Left epididymis: Increased vascularity within the left epididymis without measurable mass.  Hydrocele: Complex moderate sized left hydrocele. Small right hydrocele.  Varicocele:  Bilateral small varicoceles.  Pulsed Doppler interrogation of both testes demonstrates normal low resistance arterial and venous waveforms bilaterally.  IMPRESSION: Diffusely increased vascularity within the left testis and epididymis, most compatible with epididymal orchitis.  No sonographic evidence for testicular torsion on either side.  Complicated left hydrocele. Has there been any history of trauma to suggest this represents hematoma?  No intratesticular mass on either side.   Electronically Signed   By: Conchita Paris M.D.   On: 01/21/2015 16:09   [place] Art/ven Flow Abd Pelv Doppler  01/21/2015   CLINICAL DATA:  Left testicular swelling, scrotal erythema  EXAM: SCROTAL  ULTRASOUND  DOPPLER ULTRASOUND OF THE TESTICLES  TECHNIQUE: Complete ultrasound examination of the testicles, epididymis, and other scrotal structures was performed. Color and spectral Doppler ultrasound were also utilized to evaluate blood flow to the testicles.  COMPARISON:  No similar prior exam is available at this institution for comparison or on [facility] PACS.  FINDINGS: Right testicle  Measurements: 4.7 x 2.7 x 2.7 cm. No mass or microlithiasis visualized.  Left testicle  Measurements: 4.4 x 3.8 x 3.5 cm. No mass or microlithiasis. There is diffusely increased vascularity throughout the left testis.  Right epididymis:  Normal in size and appearance.  Left epididymis: Increased vascularity within the left epididymis without measurable mass.  Hydrocele: Complex moderate sized left hydrocele. Small right hydrocele.  Varicocele:  Bilateral small varicoceles.  Pulsed Doppler interrogation of both testes demonstrates normal low resistance arterial and venous waveforms bilaterally.  IMPRESSION: Diffusely increased vascularity within the left testis and epididymis, most compatible with epididymal orchitis.  No sonographic evidence for testicular torsion on either side.  Complicated left hydrocele. Has there been any history of trauma to suggest this represents hematoma?  No intratesticular mass on either side.   Electronically Signed   By: Conchita Paris M.D.   On: 01/21/2015 16:09     EKG Interpretation None       1:29 PM Patient seen and examined. Work-up initiated.    Vital signs reviewed and are as follows: BP 126/73 mmHg  Pulse 98  Temp(Src) 98.3 F (36.8 C) (Oral)  Resp 22  SpO2 96%  5:09 PM Patient updated on [place] results. Will d/c to home with Cipro x 14 days. Urology referral given.  Patient will call for urology appointment on Monday. Patient states that his PCP does not perform routine prostate exams. He will discuss this with urologist.   Patient encouraged to return in emergency  Department immediately with fever, vomiting, spreading erythema from the scrotum. He verbalizes understanding and agrees with plan.   Patient discussed with Dr. Billy Fischer.   Patient counseled on use of narcotic pain medications. Counseled not to combine these medications with others containing tylenol. Urged not to drink alcohol, drive, or perform any other activities that requires focus while taking these medications. The patient verbalizes understanding and agrees with the plan.   MDM   Final diagnoses:  Scrotal swelling  Acute epididymitis   Patient with scrotal swelling with epididymitis demonstrated on ultrasound. No abscess noted. Patient states he had previous L testicular 'mass' evaluated and states it  was benign. No evidence of abscess today. WBC is 10.3. Vitals are normal without fever. Pt appears well, non-toxic. No prostate symptoms. Feel patient can be d/c to home with appropriate follow-up and return instructions.     Carlisle Cater, PA-C 01/21/15 [place], MD 01/21/15 2202

## 2015-01-21 NOTE — ED Notes (Addendum)
He c/o L testicular pain and swelling increasingly worse since Tuesday. He went to his family doctor and they referred him to ED for possible imaging and urology consult. Reports normal urination. hes been taking aleve with some relief

## 2015-02-02 IMAGING — CT CT ABD-PELV W/ CM
3 of 5 series · 13 of 36 positions shown, 19 images · IV contrast ([ID] [id number])
Comparison: Ultrasound abdomen 07/13/2012

CLINICAL DATA: Lower quadrant pain

CT ABDOMEN AND PELVIS WITH CONTRAST
TECHNIQUE: Multidetector CT imaging of the abdomen and pelvis was
performed following the standard protocol during bolus
administration of intravenous contrast.
Contrast: 125mL OMNIPAQUE IOHEXOL 300 MG/ML  SOLN

[Series 3: abd/pelvis · axial · 0.94mm/px · z∈[-430,-40]mm · 7 of 104 slices shown, 12 images]
[im 13/104  soft-tissue]
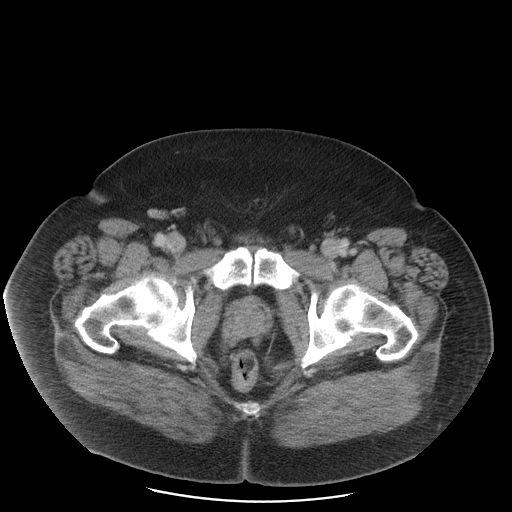
[im 13/104  bone]
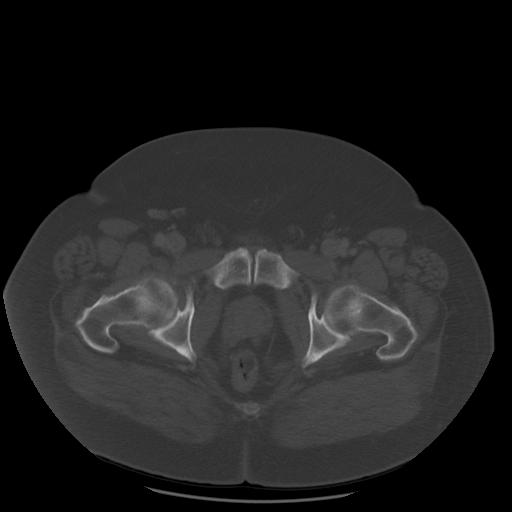
[im 26/104  soft-tissue]
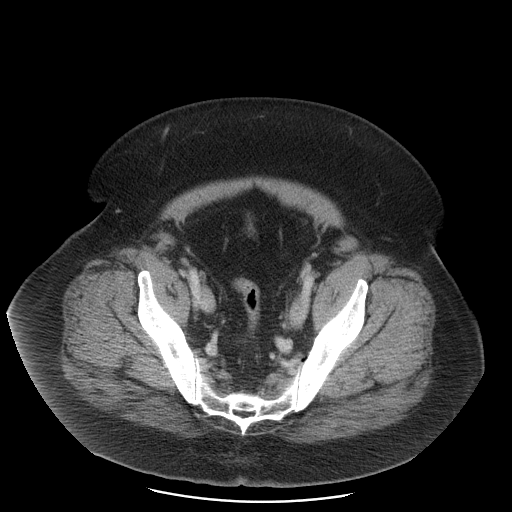
[im 39/104  soft-tissue]
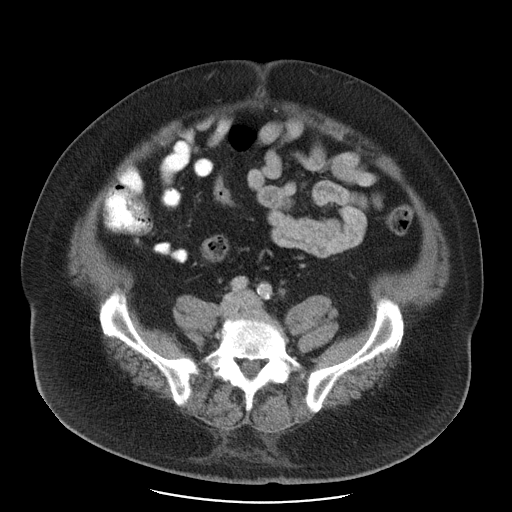
[im 52/104  soft-tissue]
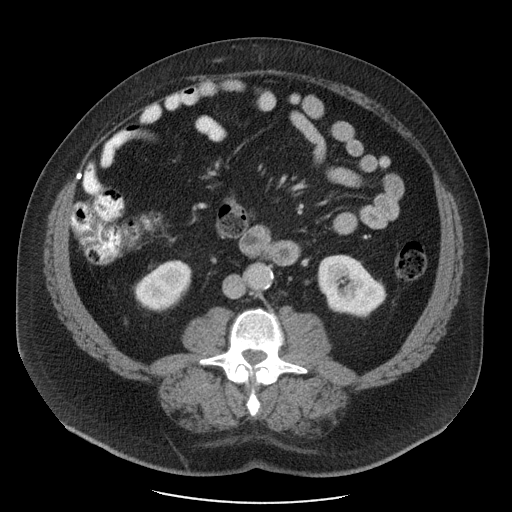
[im 52/104  lung]
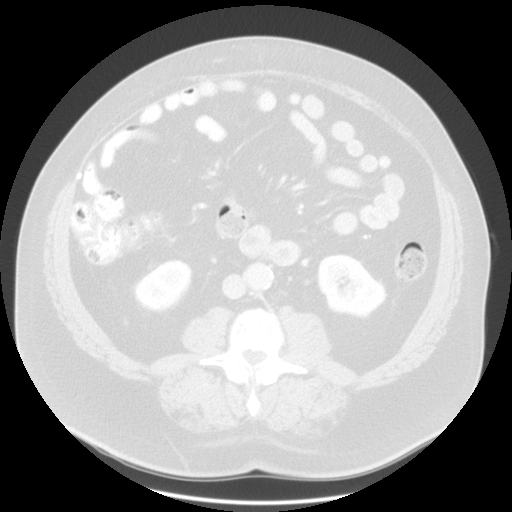
[im 65/104  soft-tissue]
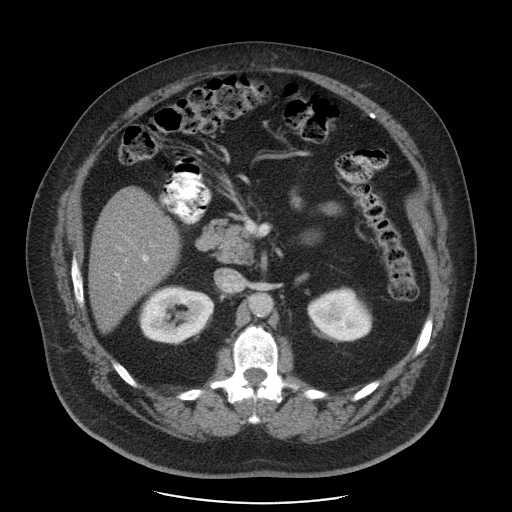
[im 65/104  lung]
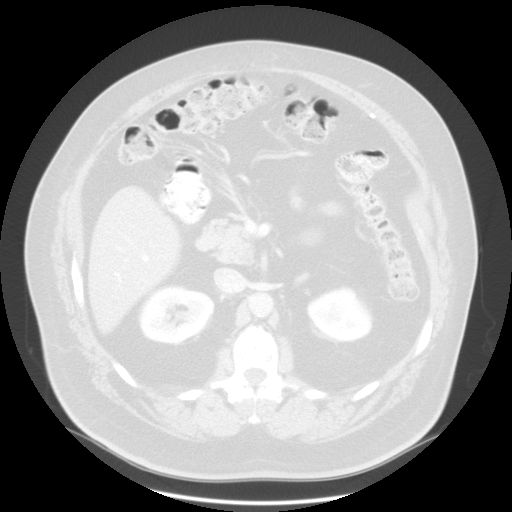
[im 78/104  soft-tissue]
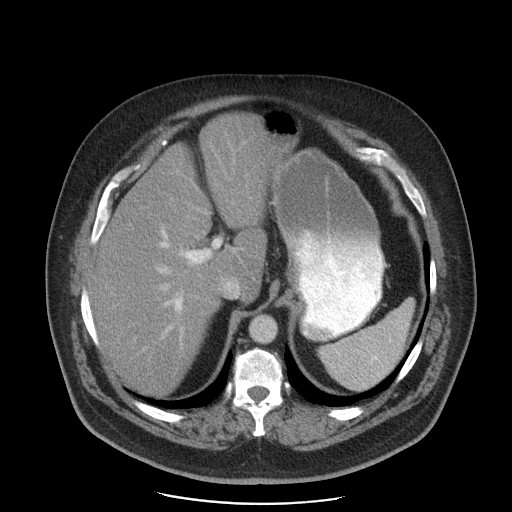
[im 78/104  lung]
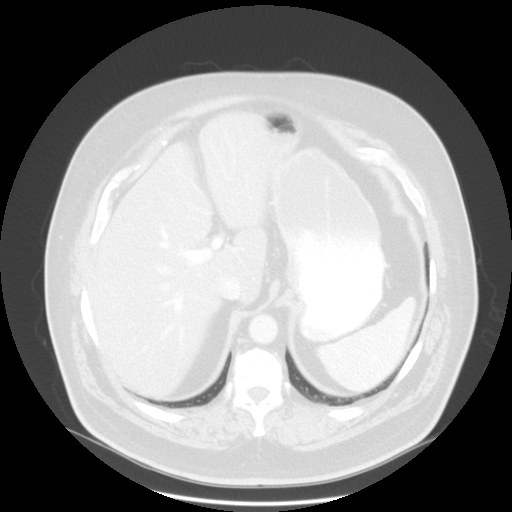
[im 91/104  soft-tissue]
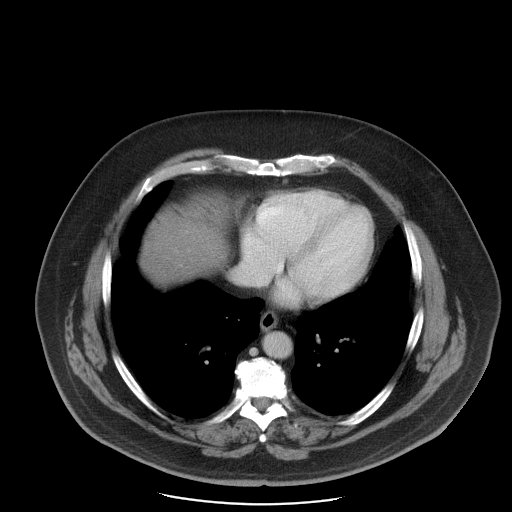
[im 91/104  lung]
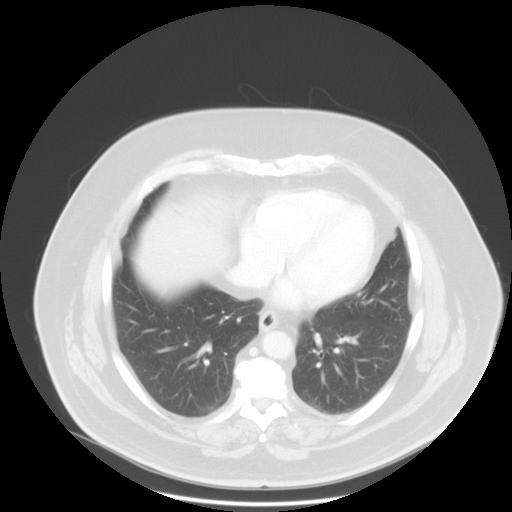

[Series 601: coronal body · coronal · 1.03mm/px · 1 of 172 slices shown, 2 images]
[im 58/172  soft-tissue]
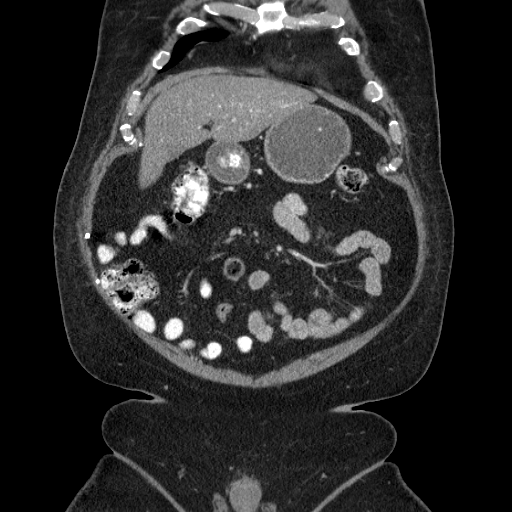
[im 58/172  bone]
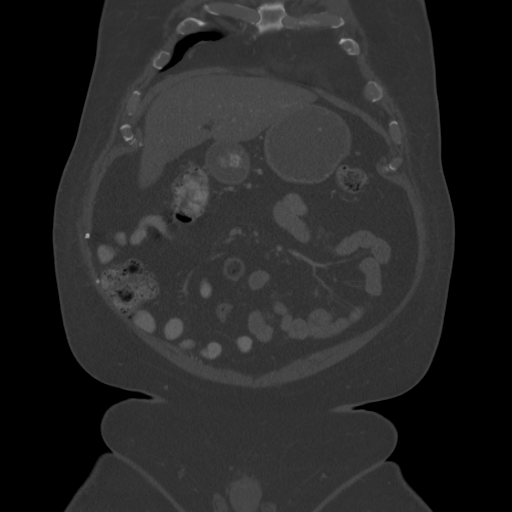

[Series 602: sagittal body · sagittal · 1.03mm/px · 5 of 192 slices shown]
[im 12/192  soft-tissue]
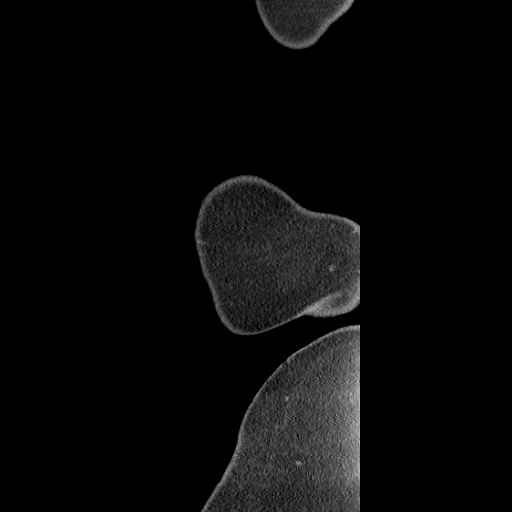
[im 36/192  soft-tissue]
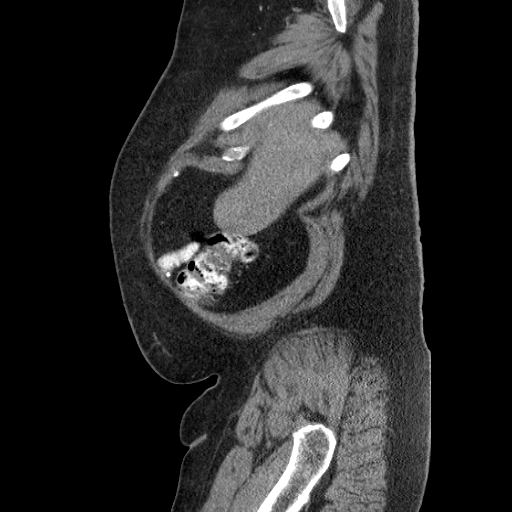
[im 60/192  soft-tissue]
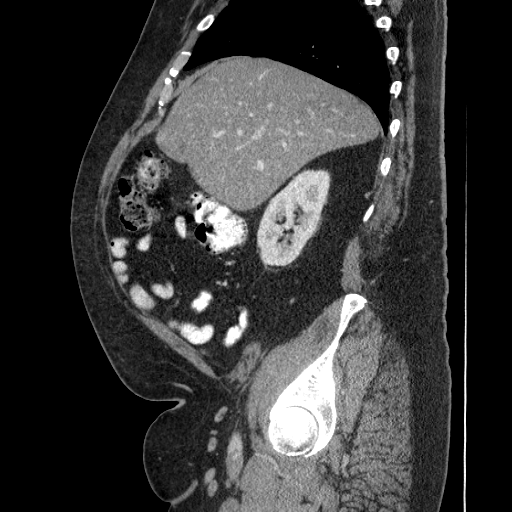
[im 84/192  soft-tissue]
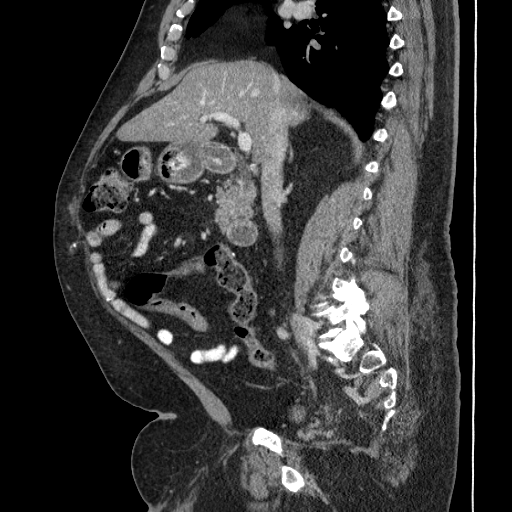
[im 108/192  soft-tissue]
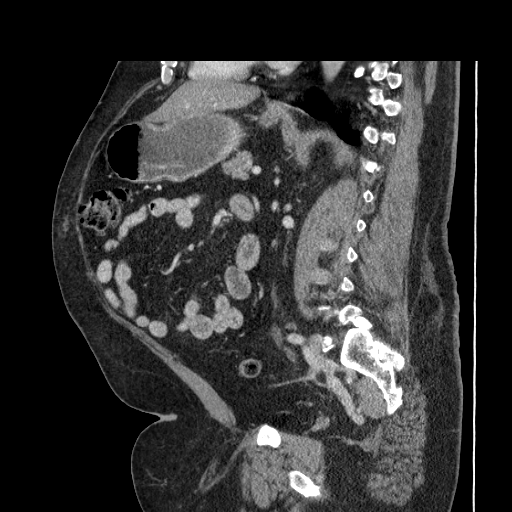

[13 of 36 positions shown; findings below may reference images not displayed]

FINDINGS: The lung bases are clear.  Heart size is normal.

Prior cholecystectomy.  Low density in the liver compatible with
mild fatty infiltration.  No focal liver lesions.  Spleen is normal
in size.  Pancreas and kidneys are normal.

Negative for mass or adenopathy in the abdomen.

Negative for bowel obstruction or bowel thickening.  Appendix is
normal.

Negative for abdominal or inguinal hernia.

Grade 1 anterior slip L4-5.  Disc degeneration with spondylosis and
facet degeneration at L4-5 and L5 S1.  Negative for fracture.
IMPRESSION: Fatty infiltration of liver.  No acute abnormality in the abdomen.

Lumbar degenerative changes.

## 2015-07-04 DIAGNOSIS — J069 Acute upper respiratory infection, unspecified: Secondary | ICD-10-CM | POA: Diagnosis not present

## 2015-08-11 DIAGNOSIS — I10 Essential (primary) hypertension: Secondary | ICD-10-CM | POA: Diagnosis not present

## 2015-08-11 DIAGNOSIS — E782 Mixed hyperlipidemia: Secondary | ICD-10-CM | POA: Diagnosis not present

## 2015-08-11 DIAGNOSIS — E119 Type 2 diabetes mellitus without complications: Secondary | ICD-10-CM | POA: Diagnosis not present

## 2015-08-11 DIAGNOSIS — Z6841 Body Mass Index (BMI) 40.0 and over, adult: Secondary | ICD-10-CM | POA: Diagnosis not present

## 2015-08-11 DIAGNOSIS — Z7984 Long term (current) use of oral hypoglycemic drugs: Secondary | ICD-10-CM | POA: Diagnosis not present

## 2015-10-16 DIAGNOSIS — J45909 Unspecified asthma, uncomplicated: Secondary | ICD-10-CM | POA: Diagnosis not present

## 2016-04-12 DIAGNOSIS — Z0000 Encounter for general adult medical examination without abnormal findings: Secondary | ICD-10-CM | POA: Diagnosis not present

## 2016-04-12 DIAGNOSIS — I10 Essential (primary) hypertension: Secondary | ICD-10-CM | POA: Diagnosis not present

## 2016-04-12 DIAGNOSIS — N5201 Erectile dysfunction due to arterial insufficiency: Secondary | ICD-10-CM | POA: Diagnosis not present

## 2016-04-12 DIAGNOSIS — E782 Mixed hyperlipidemia: Secondary | ICD-10-CM | POA: Diagnosis not present

## 2016-04-12 DIAGNOSIS — E119 Type 2 diabetes mellitus without complications: Secondary | ICD-10-CM | POA: Diagnosis not present

## 2016-04-12 DIAGNOSIS — Z23 Encounter for immunization: Secondary | ICD-10-CM | POA: Diagnosis not present

## 2016-04-12 DIAGNOSIS — Z113 Encounter for screening for infections with a predominantly sexual mode of transmission: Secondary | ICD-10-CM | POA: Diagnosis not present

## 2016-05-08 IMAGING — RF DG FLUORO GUIDE NDL PLC/BX
1 series · 1 of 1 positions shown · non-contrast
Comparison: none

CLINICAL DATA: Left hip pain.

[Series 1: dg fluoro guide ndl plc/bx · 1 of 1 slices shown]
[im 1/1]
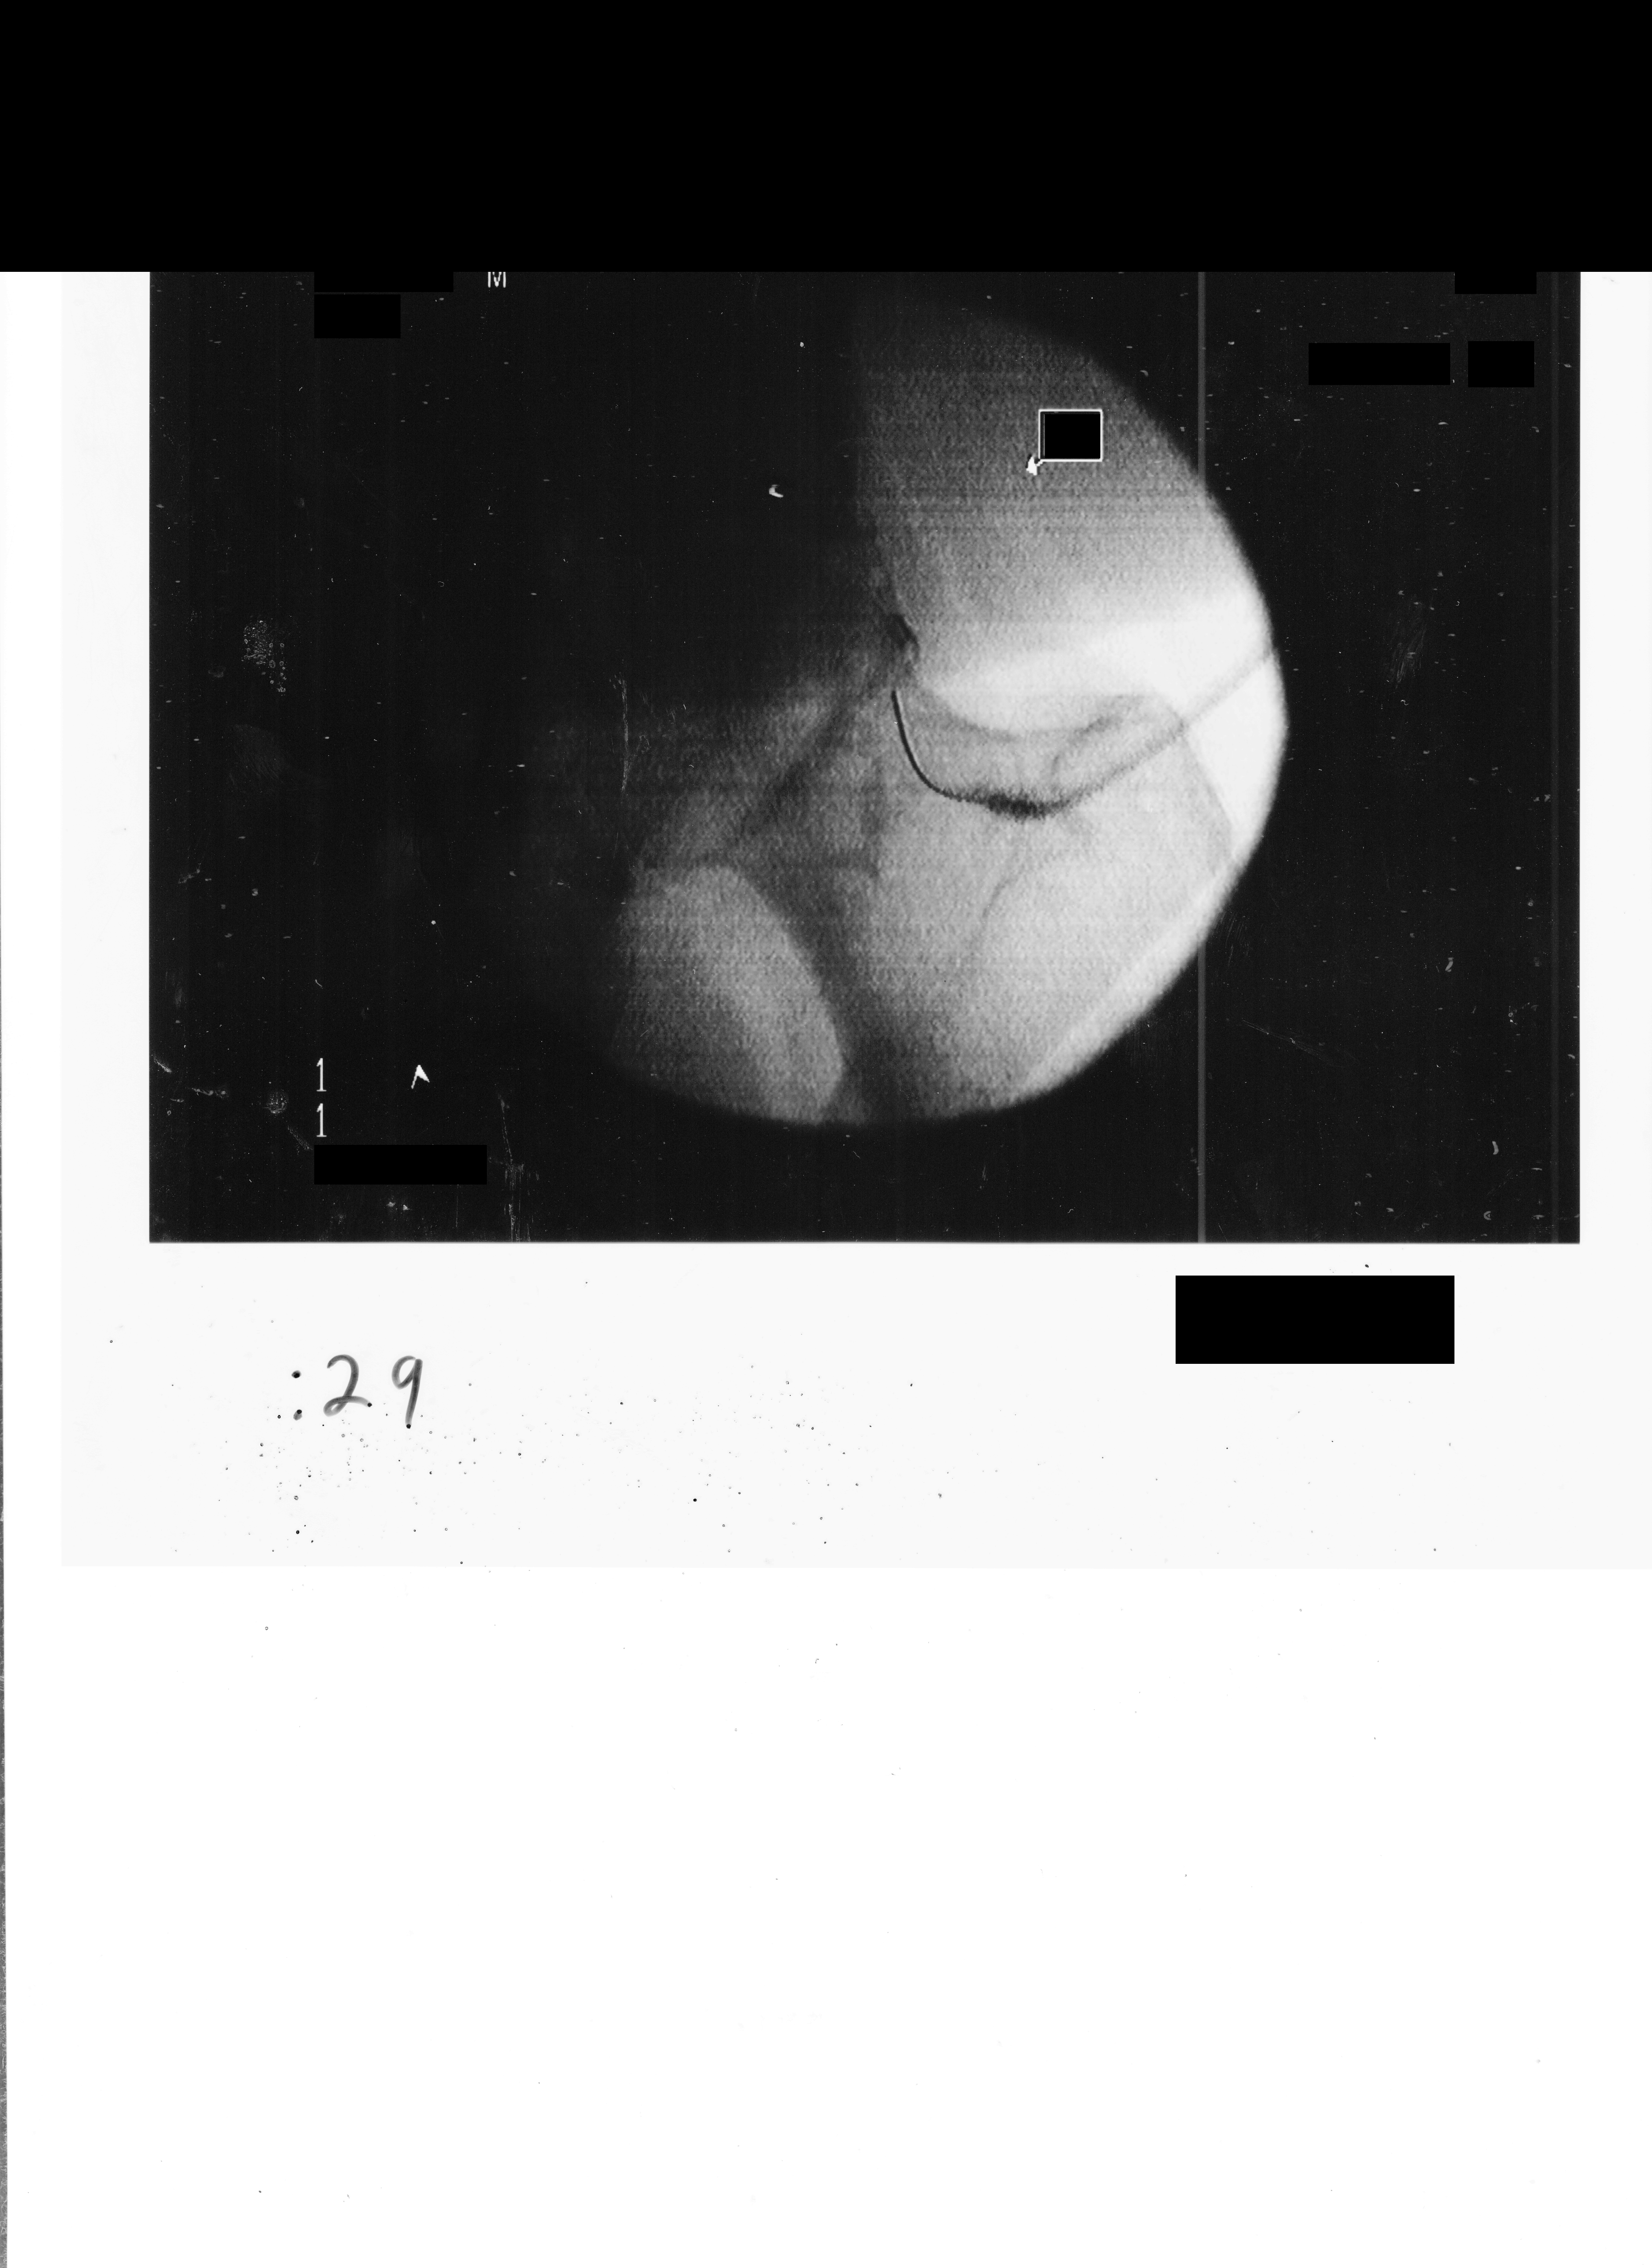

[1 of 1 positions shown; findings below may reference images not displayed]

EXAM:
LEFT HIP INJECTION UNDER FLUOROSCOPY

FLUOROSCOPY TIME:  0 min 29 seconds

PROCEDURE:
Overlying skin prepped with Betadine, draped in the usual sterile
fashion, and infiltrated locally with buffered Lidocaine. 22 gauge
spinal needle advanced to the lateral margin of the left femoral
head-neck junction. 1 ml of Lidocaine injected easily. Diagnostic
injection of iodinated contrast demonstrates intra-articular spread
without intravascular component.

120 mg Depo-Medrol and 3 mL of 1% lidocaine were then administered.
No immediate complication.
IMPRESSION: Technically successful left hip injection under fluoroscopy.

## 2016-10-23 DIAGNOSIS — E119 Type 2 diabetes mellitus without complications: Secondary | ICD-10-CM | POA: Diagnosis not present

## 2016-10-23 DIAGNOSIS — Z7984 Long term (current) use of oral hypoglycemic drugs: Secondary | ICD-10-CM | POA: Diagnosis not present

## 2016-10-23 DIAGNOSIS — L304 Erythema intertrigo: Secondary | ICD-10-CM | POA: Diagnosis not present

## 2016-10-23 DIAGNOSIS — E782 Mixed hyperlipidemia: Secondary | ICD-10-CM | POA: Diagnosis not present

## 2016-10-23 DIAGNOSIS — I10 Essential (primary) hypertension: Secondary | ICD-10-CM | POA: Diagnosis not present

## 2017-01-28 DIAGNOSIS — M1611 Unilateral primary osteoarthritis, right hip: Secondary | ICD-10-CM | POA: Diagnosis not present

## 2017-02-26 DIAGNOSIS — M1711 Unilateral primary osteoarthritis, right knee: Secondary | ICD-10-CM | POA: Diagnosis not present

## 2017-02-26 DIAGNOSIS — M1712 Unilateral primary osteoarthritis, left knee: Secondary | ICD-10-CM | POA: Diagnosis not present

## 2017-04-23 DIAGNOSIS — M1712 Unilateral primary osteoarthritis, left knee: Secondary | ICD-10-CM | POA: Diagnosis not present

## 2017-04-30 DIAGNOSIS — M1712 Unilateral primary osteoarthritis, left knee: Secondary | ICD-10-CM | POA: Diagnosis not present

## 2017-05-07 DIAGNOSIS — M1712 Unilateral primary osteoarthritis, left knee: Secondary | ICD-10-CM | POA: Diagnosis not present

## 2017-05-14 DIAGNOSIS — M1712 Unilateral primary osteoarthritis, left knee: Secondary | ICD-10-CM | POA: Diagnosis not present

## 2017-05-21 DIAGNOSIS — M1712 Unilateral primary osteoarthritis, left knee: Secondary | ICD-10-CM | POA: Diagnosis not present

## 2017-05-27 DIAGNOSIS — M1611 Unilateral primary osteoarthritis, right hip: Secondary | ICD-10-CM | POA: Diagnosis not present

## 2017-08-13 DIAGNOSIS — M1611 Unilateral primary osteoarthritis, right hip: Secondary | ICD-10-CM | POA: Diagnosis not present

## 2017-09-02 DIAGNOSIS — M1611 Unilateral primary osteoarthritis, right hip: Secondary | ICD-10-CM | POA: Diagnosis not present

## 2017-11-20 DIAGNOSIS — E782 Mixed hyperlipidemia: Secondary | ICD-10-CM | POA: Diagnosis not present

## 2017-11-20 DIAGNOSIS — Z6841 Body Mass Index (BMI) 40.0 and over, adult: Secondary | ICD-10-CM | POA: Diagnosis not present

## 2017-11-20 DIAGNOSIS — E119 Type 2 diabetes mellitus without complications: Secondary | ICD-10-CM | POA: Diagnosis not present

## 2017-11-20 DIAGNOSIS — I10 Essential (primary) hypertension: Secondary | ICD-10-CM | POA: Diagnosis not present

## 2017-11-20 DIAGNOSIS — Z7984 Long term (current) use of oral hypoglycemic drugs: Secondary | ICD-10-CM | POA: Diagnosis not present

## 2017-12-10 DIAGNOSIS — R945 Abnormal results of liver function studies: Secondary | ICD-10-CM | POA: Diagnosis not present

## 2018-01-22 DIAGNOSIS — M1611 Unilateral primary osteoarthritis, right hip: Secondary | ICD-10-CM | POA: Diagnosis not present

## 2018-01-26 ENCOUNTER — Other Ambulatory Visit: Payer: Self-pay | Admitting: Orthopaedic Surgery

## 2018-01-28 NOTE — Pre-Procedure Instructions (Signed)
[name]  01/28/2018      [id number] - [place], [place] [place] [address] Phone: [phone] Fax: [phone]  [facility] [phone] - [place], [place] - [address] [place] [address] Phone: [phone] Fax: [phone]    Your procedure is scheduled on Tues., Aug. 6, 2019 from 12:30PM-3:00PM  Report to [facility] Admitting 10:30AM  Call this number if you have problems the morning of surgery:  [phone]   Remember:  Do not eat or drink after midnight on Aug. 5th    Take these medicines the morning of surgery with A SIP OF WATER: NONE  Follow your surgeon's instructions on when to stop Aspirin.  If no instructions were given by your surgeon then you will need to call the office to get those instructions.    As of today, stop taking all Other Aspirin Products, Vitamins, Fish oils, and Herbal medications. Also stop all NSAIDS i.e. Advil, Ibuprofen, Motrin, Aleve, Anaprox, Naproxen, BC, Goody Powders, and all Supplements.   Do not wear jewelry.  Do not wear lotions, powders, colognes, or deodorant.  Do not shave 48 hours prior to surgery.  Men may shave face.  Do not bring valuables to the hospital.  [facility] is not responsible for any belongings or valuables.  Contacts, dentures or bridgework may not be worn into surgery.  Leave your suitcase in the car.  After surgery it may be brought to your room.  For patients admitted to the hospital, discharge time will be determined by your treatment team.  Patients discharged the day of surgery will not be allowed to drive home.   Special instructions:  [place]- Preparing For Surgery  Before surgery, you can play an important role. Because skin is not sterile, your skin needs to be as free of germs as possible. You can reduce the number of  germs on your skin by washing with CHG (chlorahexidine gluconate) Soap before surgery.  CHG is an antiseptic cleaner which kills germs and bonds with the skin to continue killing germs even after washing.    Oral Hygiene is also important to reduce your risk of infection.  Remember - BRUSH YOUR TEETH THE MORNING OF SURGERY WITH YOUR REGULAR TOOTHPASTE  Please do not use if you have an allergy to CHG or antibacterial soaps. If your skin becomes reddened/irritated stop using the CHG.  Do not shave (including legs and underarms) for at least 48 hours prior to first CHG shower. It is OK to shave your face.  Please follow these instructions carefully.   1. Shower the NIGHT BEFORE SURGERY and the MORNING OF SURGERY with CHG.   2. If you chose to wash your hair, wash your hair first as usual with your normal shampoo.  3. After you shampoo, rinse your hair and body thoroughly to remove the shampoo.  4. Use CHG as you would any other liquid soap. You can apply CHG directly to the skin and wash gently with a scrungie or a clean washcloth.   5. Apply the CHG Soap to your body ONLY FROM THE NECK DOWN.  Do not use on open wounds or open sores. Avoid contact with your eyes, ears, mouth and genitals (private parts). Wash Face and genitals (private parts)  with your normal soap.  6. Wash thoroughly, paying special attention to the area where your surgery will be  performed.  7. Thoroughly rinse your body with warm water from the neck down.  8. DO NOT shower/wash with your normal soap after using and rinsing off the CHG Soap.  9. Pat yourself dry with a CLEAN TOWEL.  10. Wear CLEAN PAJAMAS to bed the night before surgery, wear comfortable clothes the morning of surgery  11. Place CLEAN SHEETS on your bed the night of your first shower and DO NOT SLEEP WITH PETS.  Day of Surgery:  Do not apply any deodorants/lotions.  Please wear clean clothes to the hospital/surgery center.   Remember to brush your  teeth WITH YOUR REGULAR TOOTHPASTE.  Please read over the following fact sheets that you were given. Pain Booklet, Coughing and Deep Breathing, MRSA Information and Surgical Site Infection Prevention

## 2018-01-29 ENCOUNTER — Encounter (HOSPITAL_COMMUNITY): Payer: Self-pay

## 2018-01-29 ENCOUNTER — Other Ambulatory Visit: Payer: Self-pay | Admitting: Orthopaedic Surgery

## 2018-01-29 ENCOUNTER — Other Ambulatory Visit: Payer: Self-pay

## 2018-01-29 ENCOUNTER — Ambulatory Visit (HOSPITAL_COMMUNITY)
Admission: RE | Admit: 2018-01-29 | Discharge: 2018-01-29 | Disposition: A | Discharge status: Home / Self Care | Payer: Managed Care, Other (non HMO) | Source: Ambulatory Visit | Attending: Orthopaedic Surgery | Admitting: Orthopaedic Surgery

## 2018-01-29 ENCOUNTER — Encounter (HOSPITAL_COMMUNITY)
Admission: RE | Admit: 2018-01-29 | Discharge: 2018-01-29 | Disposition: A | Discharge status: Home / Self Care | Payer: Managed Care, Other (non HMO) | Source: Ambulatory Visit | Attending: Orthopaedic Surgery | Admitting: Orthopaedic Surgery

## 2018-01-29 DIAGNOSIS — Z01812 Encounter for preprocedural laboratory examination: Secondary | ICD-10-CM | POA: Insufficient documentation

## 2018-01-29 DIAGNOSIS — Z01818 Encounter for other preprocedural examination: Secondary | ICD-10-CM | POA: Insufficient documentation

## 2018-01-29 DIAGNOSIS — J9811 Atelectasis: Secondary | ICD-10-CM | POA: Diagnosis not present

## 2018-01-29 DIAGNOSIS — Z0183 Encounter for blood typing: Secondary | ICD-10-CM | POA: Insufficient documentation

## 2018-01-29 HISTORY — DX: Prediabetes: R73.03

## 2018-01-29 HISTORY — DX: Unilateral primary osteoarthritis, right hip: M16.11

## 2018-01-29 LAB — TYPE AND SCREEN
ABO/RH(D): O POS
Antibody Screen: NEGATIVE

## 2018-01-29 LAB — CBC WITH DIFFERENTIAL/PLATELET
ABS IMMATURE GRANULOCYTES: 0 10*3/uL (ref 0.0–0.1)
Basophils Absolute: 0 10*3/uL (ref 0.0–0.1)
Basophils Relative: 0 %
EOS PCT: 2 %
Eosinophils Absolute: 0.2 10*3/uL (ref 0.0–0.7)
HCT: 50.8 % (ref 39.0–52.0)
HEMOGLOBIN: 16.8 g/dL (ref 13.0–17.0)
Immature Granulocytes: 0 %
LYMPHS PCT: 31 %
Lymphs Abs: 2 10*3/uL (ref 0.7–4.0)
MCH: 30.1 pg (ref 26.0–34.0)
MCHC: 33.1 g/dL (ref 30.0–36.0)
MCV: 91 fL (ref 78.0–100.0)
MONO ABS: 0.7 10*3/uL (ref 0.1–1.0)
MONOS PCT: 11 %
NEUTROS ABS: 3.5 10*3/uL (ref 1.7–7.7)
Neutrophils Relative %: 56 %
Platelets: 172 10*3/uL (ref 150–400)
RBC: 5.58 MIL/uL (ref 4.22–5.81)
RDW: 12.7 % (ref 11.5–15.5)
WBC: 6.4 10*3/uL (ref 4.0–10.5)

## 2018-01-29 LAB — URINALYSIS, ROUTINE W REFLEX MICROSCOPIC
Bilirubin Urine: NEGATIVE
GLUCOSE, UA: NEGATIVE mg/dL
Hgb urine dipstick: NEGATIVE
KETONES UR: NEGATIVE mg/dL
LEUKOCYTES UA: NEGATIVE
NITRITE: NEGATIVE
PROTEIN: NEGATIVE mg/dL
Specific Gravity, Urine: 1.017 (ref 1.005–1.030)
pH: 5 (ref 5.0–8.0)

## 2018-01-29 LAB — COMPREHENSIVE METABOLIC PANEL
ALK PHOS: 61 U/L (ref 38–126)
ALT: 26 U/L (ref 0–44)
AST: 23 U/L (ref 15–41)
Albumin: 4 g/dL (ref 3.5–5.0)
Anion gap: 11 (ref 5–15)
BILIRUBIN TOTAL: 0.7 mg/dL (ref 0.3–1.2)
BUN: 18 mg/dL (ref 8–23)
CALCIUM: 9.8 mg/dL (ref 8.9–10.3)
CO2: 24 mmol/L (ref 22–32)
CREATININE: 0.95 mg/dL (ref 0.61–1.24)
Chloride: 104 mmol/L (ref 98–111)
Glucose, Bld: 125 mg/dL — ABNORMAL HIGH (ref 70–99)
Potassium: 4 mmol/L (ref 3.5–5.1)
Sodium: 139 mmol/L (ref 135–145)
TOTAL PROTEIN: 7.2 g/dL (ref 6.5–8.1)

## 2018-01-29 LAB — APTT: aPTT: 28 seconds (ref 24–36)

## 2018-01-29 LAB — SURGICAL PCR SCREEN
MRSA, PCR: NEGATIVE
STAPHYLOCOCCUS AUREUS: POSITIVE — AB

## 2018-01-29 LAB — PROTIME-INR
INR: 1.07
Prothrombin Time: 13.8 seconds (ref 11.4–15.2)

## 2018-01-29 LAB — GLUCOSE, CAPILLARY: Glucose-Capillary: 157 mg/dL — ABNORMAL HIGH (ref 70–99)

## 2018-01-29 LAB — HEMOGLOBIN A1C
HEMOGLOBIN A1C: 5.7 % — AB (ref 4.8–5.6)
MEAN PLASMA GLUCOSE: 116.89 mg/dL

## 2018-01-29 NOTE — Progress Notes (Signed)
Pt made aware of pcr screen positive for Staph. Prescription was called in to the [facility].

## 2018-01-29 NOTE — H&P (Signed)
TOTAL HIP ADMISSION H&P  Patient is admitted for right total hip arthroplasty.  Subjective:  Chief Complaint: right hip pain  HPI: [name], 72 y.o. male, has a history of pain and functional disability in the right hip(s) due to arthritis and patient has failed non-surgical conservative treatments for greater than 12 weeks to include NSAID's and/or analgesics, corticosteriod injections, flexibility and strengthening excercises, supervised PT with diminished ADL's post treatment, use of assistive devices, weight reduction as appropriate and activity modification.  Onset of symptoms was gradual starting 5 years ago with gradually worsening course since that time.The patient noted no past surgery on the right hip(s).  Patient currently rates pain in the right hip at 10 out of 10 with activity. Patient has night pain, worsening of pain with activity and weight bearing, trendelenberg gait, pain that interfers with activities of daily living and crepitus. Patient has evidence of subchondral cysts, subchondral sclerosis, periarticular osteophytes and joint space narrowing by imaging studies. This condition presents safety issues increasing the risk of falls. There is no current active infection.  Patient Active Problem List   Diagnosis Date Noted  . Postoperative anemia due to acute blood loss 11/16/2014  . Status post revision of total hip 11/13/2014  . Morbid obesity ([place]) 11/13/2014  . Primary osteoarthritis of left hip 11/08/2014   Past Medical History:  Diagnosis Date  . ED (erectile dysfunction)    takes Viagra as needed  . History of bronchitis last time about 42yrs ago  . Hyperlipidemia    takes Simvastatin daily  . Hypertension    takes Lisinopril daily  . Joint pain   . Osteoarthritis    knee  . Osteoarthritis of right hip   . Pre-diabetes    Takes Metformin PRN    Past Surgical History:  Procedure Laterality Date  . achiles tendon Left   . CHOLECYSTECTOMY     with mesh   . COLONOSCOPY    . FINGER SURGERY     left pointer   . KNEE ARTHROSCOPY Left   . REVISION TOTAL HIP ARTHROPLASTY Left 11/13/2014  . SHOULDER SURGERY Right   . STEROID INJECTION TO SCAR     about 6 months ago  . TONSILLECTOMY    . TOTAL HIP ARTHROPLASTY Left 11/08/2014   Procedure: TOTAL HIP ARTHROPLASTY ANTERIOR APPROACH;  Surgeon: Melrose Nakayama, MD;  Location: [facility];  Service: Orthopedics;  Laterality: Left;  . TOTAL HIP ARTHROPLASTY Left 11/13/2014   Procedure: REVISION OF LEFT HIP ARTHROPLASTY ANTERIOR APPROACH;  Surgeon: Melrose Nakayama, MD;  Location: [facility];  Service: Orthopedics;  Laterality: Left;  Marland Kitchen VEIN LIGATION AND STRIPPING Left at age 29  . wisdom teeth extracted  1972  . WRIST SURGERY Left at age 14    No current facility-administered medications for this encounter.    Current Outpatient Medications  Medication Sig Dispense Refill Last Dose  . cholecalciferol (VITAMIN D) 1000 units tablet Take 1,000 Units by mouth at bedtime.     Marland Kitchen lisinopril (PRINIVIL,ZESTRIL) 10 MG tablet Take 10 mg by mouth at bedtime.    01/21/2015 at Unknown time  . Multiple Vitamin (MULTIVITAMIN WITH MINERALS) TABS tablet Take 1 tablet by mouth daily.     . naproxen sodium (ANAPROX) 220 MG tablet Take 220-660 mg by mouth 2 (two) times daily as needed (pain). ALEVE    01/21/2015 at 800  . Omega-3 Fatty Acids (FISH OIL) 500 MG CAPS Take 500 mg by mouth at bedtime.     Marland Kitchen  simvastatin (ZOCOR) 20 MG tablet Take 20 mg by mouth at bedtime.    01/21/2015 at Unknown time  . vitamin B-12 (CYANOCOBALAMIN) 1000 MCG tablet Take 1,000 mcg by mouth daily.     . Vitamin E 400 units TABS Take 400 Units by mouth at bedtime.     Marland Kitchen aspirin EC 81 MG tablet Take 81 mg by mouth at bedtime.   01/20/2015 at Unknown time  . metFORMIN (GLUCOPHAGE) 500 MG tablet Take 500 mg by mouth daily as needed (For Elevated BS. Pt takes depending on the meal eaten).      No Known Allergies  Social History   Tobacco Use  . Smoking status:  Former Research scientist (life sciences)  . Smokeless tobacco: Never Used  . Tobacco comment: quit smoking in 1993  Substance Use Topics  . Alcohol use: Yes    Comment: rarely    No family history on file.   Review of Systems  Musculoskeletal: Positive for joint pain.       Left hip  All other systems reviewed and are negative.   Objective:  Physical Exam  Constitutional: He is oriented to person, place, and time. He appears well-developed and well-nourished.  HENT:  Head: Normocephalic and atraumatic.  Eyes: Pupils are equal, round, and reactive to light.  Neck: Normal range of motion.  Cardiovascular: Normal rate and regular rhythm.  Respiratory: Effort normal.  GI: Soft.  Musculoskeletal:  Examination of the right hip shows limited range of motion and pain at the groin with attempted internal range of motion.  Leg lengths appear about equal.  Normal sensation and motor function throughout both lower extremities.  He is neurovascularly intact distally.    Neurological: He is alert and oriented to person, place, and time.  Skin: Skin is warm and dry.  Psychiatric: He has a normal mood and affect. His behavior is normal. Judgment and thought content normal.    Vital signs in last 24 hours: Temp:  [98.1 F (36.7 C)] 98.1 F (36.7 C) (08/01 0811) Pulse Rate:  [91] 91 (08/01 0811) Resp:  [20] 20 (08/01 0811) BP: (129)/(76) 129/76 (08/01 0811) SpO2:  [97 %] 97 % (08/01 0811) Weight:  [119.9 kg (264 lb 4.8 oz)] 119.9 kg (264 lb 4.8 oz) (08/01 0811)  Labs:   Estimated body mass index is 39.03 kg/m as calculated from the following:   Height as of 01/29/18: 5\' 9"  (1.753 m).   Weight as of 01/29/18: 119.9 kg (264 lb 4.8 oz).   Imaging Review Plain radiographs demonstrate severe degenerative joint disease of the right hip(s). The bone quality appears to be good for age and reported activity level.    Preoperative templating of the joint replacement has been completed, documented, and submitted to the  Operating Room personnel in order to optimize intra-operative equipment management.     Assessment/Plan:  End stage primary arthritis, right hip(s)  The patient history, physical examination, clinical judgement of the provider and imaging studies are consistent with end stage degenerative joint disease of the right hip(s) and total hip arthroplasty is deemed medically necessary. The treatment options including medical management, injection therapy, arthroscopy and arthroplasty were discussed at length. The risks and benefits of total hip arthroplasty were presented and reviewed. The risks due to aseptic loosening, infection, stiffness, dislocation/subluxation,  thromboembolic complications and other imponderables were discussed.  The patient acknowledged the explanation, agreed to proceed with the plan and consent was signed. Patient is being admitted for inpatient treatment for surgery, pain control,  PT, OT, prophylactic antibiotics, VTE prophylaxis, progressive ambulation and ADL's and discharge planning.The patient is planning to be discharged home with home health services

## 2018-01-29 NOTE — Progress Notes (Signed)
PCP - Dr. Serita Grammes  Cardiologist - Denies  Chest x-ray - 01/29/18  EKG - 01/29/18  Stress Test - Denies  ECHO - Denies  Cardiac Cath - Denies  Sleep Study - Denies CPAP - None  LABS- 01/29/18: CBC w/D, CMP, PT, PTT, T/S, UA, PCR  ASA- Stopped 2 wks ago  HA1C- 01/29/18 Fasting Blood Sugar - 120-170, Today, 157 Checks Blood Sugar ___1__ time a week- Pt is pre-diabetic, and only takes Metformin as needed, depending on what kind of meal eaten.  Anesthesia- No  Pt denies having chest pain, sob, or fever at this time. All instructions explained to the pt, with a verbal understanding of the material. Pt agrees to go over the instructions while at home for a better understanding. The opportunity to ask questions was provided.

## 2018-02-02 MED ORDER — TRANEXAMIC ACID 1000 MG/10ML IV SOLN
1000.0000 mg | INTRAVENOUS | Status: AC
  Administered 2018-02-03: 1000 mg via INTRAVENOUS
  Filled 2018-02-02: qty 1000

## 2018-02-02 MED ORDER — DEXTROSE 5 % IV SOLN
3.0000 g | INTRAVENOUS | Status: AC
  Administered 2018-02-03: 3 g via INTRAVENOUS
  Filled 2018-02-02: qty 3

## 2018-02-03 ENCOUNTER — Inpatient Hospital Stay (HOSPITAL_COMMUNITY): Payer: Managed Care, Other (non HMO) | Admitting: Certified Registered"

## 2018-02-03 ENCOUNTER — Other Ambulatory Visit: Payer: Self-pay

## 2018-02-03 ENCOUNTER — Encounter (HOSPITAL_COMMUNITY)
Admission: RE | Disposition: A | Discharge status: Home Health | Payer: Self-pay | Source: Ambulatory Visit | Attending: Orthopaedic Surgery

## 2018-02-03 ENCOUNTER — Inpatient Hospital Stay (HOSPITAL_COMMUNITY)
Admission: RE | Admit: 2018-02-03 | Discharge: 2018-02-06 | DRG: 470 | Disposition: A | Discharge status: Home Health | Payer: Managed Care, Other (non HMO) | Attending: Orthopaedic Surgery | Admitting: Orthopaedic Surgery

## 2018-02-03 ENCOUNTER — Encounter (HOSPITAL_COMMUNITY): Payer: Self-pay | Admitting: Certified Registered"

## 2018-02-03 ENCOUNTER — Inpatient Hospital Stay (HOSPITAL_COMMUNITY): Payer: Managed Care, Other (non HMO)

## 2018-02-03 DIAGNOSIS — Z419 Encounter for procedure for purposes other than remedying health state, unspecified: Secondary | ICD-10-CM

## 2018-02-03 DIAGNOSIS — I10 Essential (primary) hypertension: Secondary | ICD-10-CM | POA: Diagnosis present

## 2018-02-03 DIAGNOSIS — Z87891 Personal history of nicotine dependence: Secondary | ICD-10-CM | POA: Diagnosis not present

## 2018-02-03 DIAGNOSIS — Z6839 Body mass index (BMI) 39.0-39.9, adult: Secondary | ICD-10-CM

## 2018-02-03 DIAGNOSIS — Z7984 Long term (current) use of oral hypoglycemic drugs: Secondary | ICD-10-CM

## 2018-02-03 DIAGNOSIS — M1611 Unilateral primary osteoarthritis, right hip: Secondary | ICD-10-CM | POA: Diagnosis present

## 2018-02-03 DIAGNOSIS — Z471 Aftercare following joint replacement surgery: Secondary | ICD-10-CM | POA: Diagnosis not present

## 2018-02-03 DIAGNOSIS — E785 Hyperlipidemia, unspecified: Secondary | ICD-10-CM | POA: Diagnosis present

## 2018-02-03 DIAGNOSIS — Z96642 Presence of left artificial hip joint: Secondary | ICD-10-CM | POA: Diagnosis present

## 2018-02-03 DIAGNOSIS — R7303 Prediabetes: Secondary | ICD-10-CM | POA: Diagnosis present

## 2018-02-03 DIAGNOSIS — Z96641 Presence of right artificial hip joint: Secondary | ICD-10-CM | POA: Diagnosis not present

## 2018-02-03 HISTORY — PX: TOTAL HIP ARTHROPLASTY: SHX124

## 2018-02-03 SURGERY — ARTHROPLASTY, HIP, TOTAL, ANTERIOR APPROACH
Anesthesia: Spinal | Site: Hip | Laterality: Right

## 2018-02-03 MED ORDER — ONDANSETRON HCL 4 MG/2ML IJ SOLN
INTRAMUSCULAR | Status: AC
  Filled 2018-02-03: qty 2

## 2018-02-03 MED ORDER — EPHEDRINE SULFATE 50 MG/ML IJ SOLN
INTRAMUSCULAR | Status: DC | PRN
  Administered 2018-02-03: 5 mg via INTRAVENOUS

## 2018-02-03 MED ORDER — METOCLOPRAMIDE HCL 5 MG/ML IJ SOLN: 5.0000 mg | Freq: Three times a day (TID) | INTRAMUSCULAR | Status: DC | PRN

## 2018-02-03 MED ORDER — FENTANYL CITRATE (PF) 100 MCG/2ML IJ SOLN
INTRAMUSCULAR | Status: AC
  Filled 2018-02-03: qty 2

## 2018-02-03 MED ORDER — HYDROCODONE-ACETAMINOPHEN 5-325 MG PO TABS
1.0000 | ORAL_TABLET | ORAL | Status: DC | PRN
  Administered 2018-02-04: 1.5 via ORAL
  Administered 2018-02-05 – 2018-02-06 (×4): 2 via ORAL
  Administered 2018-02-06: 1 via ORAL
  Filled 2018-02-03 (×3): qty 2
  Filled 2018-02-03: qty 1
  Filled 2018-02-03 (×2): qty 2

## 2018-02-03 MED ORDER — GLYCOPYRROLATE 0.2 MG/ML IJ SOLN
INTRAMUSCULAR | Status: AC
  Administered 2018-02-03: 0.2 mg
  Filled 2018-02-03: qty 1

## 2018-02-03 MED ORDER — BUPIVACAINE-EPINEPHRINE (PF) 0.25% -1:200000 IJ SOLN
INTRAMUSCULAR | Status: DC | PRN
  Administered 2018-02-03: 20 mL

## 2018-02-03 MED ORDER — 0.9 % SODIUM CHLORIDE (POUR BTL) OPTIME
TOPICAL | Status: DC | PRN
  Administered 2018-02-03: 1000 mL

## 2018-02-03 MED ORDER — GLYCOPYRROLATE PF 0.2 MG/ML IJ SOSY
PREFILLED_SYRINGE | INTRAMUSCULAR | Status: AC
  Filled 2018-02-03: qty 1

## 2018-02-03 MED ORDER — METHOCARBAMOL 1000 MG/10ML IJ SOLN
500.0000 mg | Freq: Four times a day (QID) | INTRAVENOUS | Status: DC | PRN
  Filled 2018-02-03: qty 5

## 2018-02-03 MED ORDER — MORPHINE SULFATE (PF) 2 MG/ML IV SOLN
0.5000 mg | INTRAVENOUS | Status: DC | PRN
  Filled 2018-02-03 (×2): qty 1

## 2018-02-03 MED ORDER — LISINOPRIL 10 MG PO TABS
10.0000 mg | ORAL_TABLET | Freq: Every day | ORAL | Status: DC
  Administered 2018-02-04 – 2018-02-05 (×2): 10 mg via ORAL
  Filled 2018-02-03 (×3): qty 1

## 2018-02-03 MED ORDER — HYDROCODONE-ACETAMINOPHEN 7.5-325 MG PO TABS
1.0000 | ORAL_TABLET | ORAL | Status: DC | PRN
  Administered 2018-02-03 – 2018-02-06 (×5): 1 via ORAL
  Filled 2018-02-03 (×5): qty 1

## 2018-02-03 MED ORDER — ALUM & MAG HYDROXIDE-SIMETH 200-200-20 MG/5ML PO SUSP: 30.0000 mL | ORAL | Status: DC | PRN

## 2018-02-03 MED ORDER — BUPIVACAINE-EPINEPHRINE (PF) 0.25% -1:200000 IJ SOLN
INTRAMUSCULAR | Status: AC
  Filled 2018-02-03: qty 30

## 2018-02-03 MED ORDER — FENTANYL CITRATE (PF) 250 MCG/5ML IJ SOLN
INTRAMUSCULAR | Status: DC | PRN
  Administered 2018-02-03: 50 ug via INTRAVENOUS

## 2018-02-03 MED ORDER — METOCLOPRAMIDE HCL 5 MG PO TABS: 5.0000 mg | ORAL_TABLET | Freq: Three times a day (TID) | ORAL | Status: DC | PRN

## 2018-02-03 MED ORDER — METHOCARBAMOL 500 MG PO TABS
500.0000 mg | ORAL_TABLET | Freq: Four times a day (QID) | ORAL | Status: DC | PRN
  Administered 2018-02-03 – 2018-02-06 (×2): 500 mg via ORAL
  Filled 2018-02-03 (×2): qty 1

## 2018-02-03 MED ORDER — MIDAZOLAM HCL 5 MG/5ML IJ SOLN
INTRAMUSCULAR | Status: DC | PRN
  Administered 2018-02-03: 2 mg via INTRAVENOUS

## 2018-02-03 MED ORDER — MIDAZOLAM HCL 2 MG/2ML IJ SOLN
INTRAMUSCULAR | Status: AC
Start: 2018-02-03 — End: ?
  Filled 2018-02-03: qty 2

## 2018-02-03 MED ORDER — FENTANYL CITRATE (PF) 250 MCG/5ML IJ SOLN
INTRAMUSCULAR | Status: AC
  Filled 2018-02-03: qty 5

## 2018-02-03 MED ORDER — DIPHENHYDRAMINE HCL 12.5 MG/5ML PO ELIX: 12.5000 mg | ORAL_SOLUTION | ORAL | Status: DC | PRN

## 2018-02-03 MED ORDER — CEFAZOLIN SODIUM-DEXTROSE 2-4 GM/100ML-% IV SOLN
2.0000 g | Freq: Four times a day (QID) | INTRAVENOUS | Status: AC
  Administered 2018-02-03 – 2018-02-04 (×2): 2 g via INTRAVENOUS
  Filled 2018-02-03 (×2): qty 100

## 2018-02-03 MED ORDER — KETOROLAC TROMETHAMINE 15 MG/ML IJ SOLN
7.5000 mg | Freq: Four times a day (QID) | INTRAMUSCULAR | Status: AC
  Administered 2018-02-04 (×3): 7.5 mg via INTRAVENOUS
  Filled 2018-02-03 (×3): qty 1

## 2018-02-03 MED ORDER — CHLORHEXIDINE GLUCONATE 4 % EX LIQD: 60.0000 mL | Freq: Once | CUTANEOUS | Status: DC

## 2018-02-03 MED ORDER — BUPIVACAINE IN DEXTROSE 0.75-8.25 % IT SOLN
INTRATHECAL | Status: DC | PRN
  Administered 2018-02-03: 1.8 mL via INTRATHECAL

## 2018-02-03 MED ORDER — METFORMIN HCL 500 MG PO TABS
500.0000 mg | ORAL_TABLET | Freq: Every day | ORAL | Status: DC
  Administered 2018-02-04 – 2018-02-06 (×3): 500 mg via ORAL
  Filled 2018-02-03 (×3): qty 1

## 2018-02-03 MED ORDER — OXYCODONE HCL 5 MG/5ML PO SOLN: 5.0000 mg | Freq: Once | ORAL | Status: DC | PRN

## 2018-02-03 MED ORDER — ONDANSETRON HCL 4 MG PO TABS: 4.0000 mg | ORAL_TABLET | Freq: Four times a day (QID) | ORAL | Status: DC | PRN

## 2018-02-03 MED ORDER — SIMVASTATIN 20 MG PO TABS
20.0000 mg | ORAL_TABLET | Freq: Every day | ORAL | Status: DC
  Administered 2018-02-03 – 2018-02-05 (×3): 20 mg via ORAL
  Filled 2018-02-03 (×3): qty 1

## 2018-02-03 MED ORDER — ACETAMINOPHEN 325 MG PO TABS: 325.0000 mg | ORAL_TABLET | Freq: Four times a day (QID) | ORAL | Status: DC | PRN

## 2018-02-03 MED ORDER — PHENOL 1.4 % MT LIQD: 1.0000 | OROMUCOSAL | Status: DC | PRN

## 2018-02-03 MED ORDER — FENTANYL CITRATE (PF) 100 MCG/2ML IJ SOLN
25.0000 ug | INTRAMUSCULAR | Status: DC | PRN
  Administered 2018-02-03 (×3): 25 ug via INTRAVENOUS

## 2018-02-03 MED ORDER — MENTHOL 3 MG MT LOZG: 1.0000 | LOZENGE | OROMUCOSAL | Status: DC | PRN

## 2018-02-03 MED ORDER — GLYCOPYRROLATE 0.2 MG/ML IJ SOLN: 0.2000 mg | Freq: Once | INTRAMUSCULAR | Status: DC

## 2018-02-03 MED ORDER — OXYCODONE HCL 5 MG PO TABS
5.0000 mg | ORAL_TABLET | Freq: Once | ORAL | Status: DC | PRN
  Administered 2018-02-03: 5 mg via ORAL

## 2018-02-03 MED ORDER — PROPOFOL 500 MG/50ML IV EMUL
INTRAVENOUS | Status: DC | PRN
  Administered 2018-02-03: 50 ug/kg/min via INTRAVENOUS

## 2018-02-03 MED ORDER — LACTATED RINGERS IV SOLN
INTRAVENOUS | Status: DC
  Administered 2018-02-04: 06:00:00 via INTRAVENOUS

## 2018-02-03 MED ORDER — ASPIRIN EC 325 MG PO TBEC
325.0000 mg | DELAYED_RELEASE_TABLET | Freq: Two times a day (BID) | ORAL | Status: DC
  Administered 2018-02-04 – 2018-02-06 (×5): 325 mg via ORAL
  Filled 2018-02-03 (×5): qty 1

## 2018-02-03 MED ORDER — LACTATED RINGERS IV SOLN
INTRAVENOUS | Status: DC
  Administered 2018-02-03 (×3): via INTRAVENOUS

## 2018-02-03 MED ORDER — SODIUM CHLORIDE 0.9 % IV SOLN
INTRAVENOUS | Status: DC | PRN
  Administered 2018-02-03: 50 ug/min via INTRAVENOUS

## 2018-02-03 MED ORDER — BISACODYL 5 MG PO TBEC: 5.0000 mg | DELAYED_RELEASE_TABLET | Freq: Every day | ORAL | Status: DC | PRN

## 2018-02-03 MED ORDER — DOCUSATE SODIUM 100 MG PO CAPS
100.0000 mg | ORAL_CAPSULE | Freq: Two times a day (BID) | ORAL | Status: DC
  Administered 2018-02-03 – 2018-02-06 (×6): 100 mg via ORAL
  Filled 2018-02-03 (×6): qty 1

## 2018-02-03 MED ORDER — BUPIVACAINE LIPOSOME 1.3 % IJ SUSP
20.0000 mL | INTRAMUSCULAR | Status: AC
  Administered 2018-02-03: 20 mL
  Filled 2018-02-03: qty 20

## 2018-02-03 MED ORDER — ONDANSETRON HCL 4 MG/2ML IJ SOLN: 4.0000 mg | Freq: Once | INTRAMUSCULAR | Status: DC | PRN

## 2018-02-03 MED ORDER — OXYCODONE HCL 5 MG PO TABS
ORAL_TABLET | ORAL | Status: AC
  Administered 2018-02-03: 5 mg
  Filled 2018-02-03: qty 1

## 2018-02-03 MED ORDER — ALBUMIN HUMAN 5 % IV SOLN
INTRAVENOUS | Status: AC
  Filled 2018-02-03: qty 250

## 2018-02-03 MED ORDER — TRANEXAMIC ACID 1000 MG/10ML IV SOLN
2000.0000 mg | INTRAVENOUS | Status: AC
  Administered 2018-02-03: 2000 mg via TOPICAL
  Filled 2018-02-03: qty 20

## 2018-02-03 MED ORDER — ATROPINE SULFATE 1 MG/ML IJ SOLN
INTRAMUSCULAR | Status: AC
  Filled 2018-02-03: qty 1

## 2018-02-03 MED ORDER — ACETAMINOPHEN 500 MG PO TABS
500.0000 mg | ORAL_TABLET | Freq: Four times a day (QID) | ORAL | Status: AC
  Administered 2018-02-03 – 2018-02-04 (×4): 500 mg via ORAL
  Filled 2018-02-03 (×4): qty 1

## 2018-02-03 MED ORDER — ONDANSETRON HCL 4 MG/2ML IJ SOLN
INTRAMUSCULAR | Status: DC | PRN
  Administered 2018-02-03: 4 mg via INTRAVENOUS

## 2018-02-03 MED ORDER — GLYCOPYRROLATE PF 0.2 MG/ML IJ SOSY
PREFILLED_SYRINGE | INTRAMUSCULAR | Status: DC | PRN
  Administered 2018-02-03: .2 mg via INTRAVENOUS

## 2018-02-03 MED ORDER — ALBUMIN HUMAN 5 % IV SOLN
12.5000 g | Freq: Once | INTRAVENOUS | Status: AC
  Administered 2018-02-03: 12.5 g via INTRAVENOUS

## 2018-02-03 MED ORDER — ONDANSETRON HCL 4 MG/2ML IJ SOLN: 4.0000 mg | Freq: Four times a day (QID) | INTRAMUSCULAR | Status: DC | PRN

## 2018-02-03 MED ORDER — TRANEXAMIC ACID 1000 MG/10ML IV SOLN
1000.0000 mg | Freq: Once | INTRAVENOUS | Status: AC
  Administered 2018-02-03: 1000 mg via INTRAVENOUS
  Filled 2018-02-03: qty 10

## 2018-02-03 SURGICAL SUPPLY — 47 items
BLADE SAW SGTL 18X1.27X75 (BLADE) ×2 IMPLANT
BLADE SAW SGTL 18X1.27X75MM (BLADE) ×1
CELLS DAT CNTRL 66122 CELL SVR (MISCELLANEOUS) ×1 IMPLANT
COVER PERINEAL POST (MISCELLANEOUS) ×3 IMPLANT
COVER SURGICAL LIGHT HANDLE (MISCELLANEOUS) ×5 IMPLANT
CUP PINN GRIPTON 54 100 (Cup) ×2 IMPLANT
DRAPE C-ARM 42X72 X-RAY (DRAPES) ×3 IMPLANT
DRAPE STERI IOBAN 125X83 (DRAPES) ×3 IMPLANT
DRAPE U-SHAPE 47X51 STRL (DRAPES) ×9 IMPLANT
DRSG AQUACEL AG ADV 3.5X10 (GAUZE/BANDAGES/DRESSINGS) ×3 IMPLANT
DRSG OPSITE POSTOP 4X6 (GAUZE/BANDAGES/DRESSINGS) ×3 IMPLANT
DURAPREP 26ML APPLICATOR (WOUND CARE) ×3 IMPLANT
ELECT BLADE 4.0 EZ CLEAN MEGAD (MISCELLANEOUS) ×3
ELECT REM PT RETURN 9FT ADLT (ELECTROSURGICAL) ×3
ELECTRODE BLDE 4.0 EZ CLN MEGD (MISCELLANEOUS) ×1 IMPLANT
ELECTRODE REM PT RTRN 9FT ADLT (ELECTROSURGICAL) ×1 IMPLANT
ELIMINATOR HOLE APEX DEPUY (Hips) ×2 IMPLANT
FACESHIELD WRAPAROUND (MASK) ×6 IMPLANT
FACESHIELD WRAPAROUND OR TEAM (MASK) ×2 IMPLANT
GLOVE BIO SURGEON STRL SZ8 (GLOVE) ×6 IMPLANT
GLOVE BIOGEL PI IND STRL 8 (GLOVE) ×2 IMPLANT
GLOVE BIOGEL PI INDICATOR 8 (GLOVE) ×4
GOWN STRL REUS W/ TWL LRG LVL3 (GOWN DISPOSABLE) ×1 IMPLANT
GOWN STRL REUS W/ TWL XL LVL3 (GOWN DISPOSABLE) ×2 IMPLANT
GOWN STRL REUS W/TWL LRG LVL3 (GOWN DISPOSABLE) ×3
GOWN STRL REUS W/TWL XL LVL3 (GOWN DISPOSABLE) ×6
HEAD CERAMIC DELTA 36 PLUS 1.5 (Hips) ×2 IMPLANT
KIT BASIN OR (CUSTOM PROCEDURE TRAY) ×3 IMPLANT
KIT TURNOVER KIT B (KITS) ×3 IMPLANT
LINER NEUTRAL 54X36MM PLUS 4 (Hips) ×2 IMPLANT
MANIFOLD NEPTUNE II (INSTRUMENTS) ×3 IMPLANT
NEEDLE HYPO 22GX1.5 SAFETY (NEEDLE) ×3 IMPLANT
NS IRRIG 1000ML POUR BTL (IV SOLUTION) ×3 IMPLANT
PACK TOTAL JOINT (CUSTOM PROCEDURE TRAY) ×3 IMPLANT
PAD ARMBOARD 7.5X6 YLW CONV (MISCELLANEOUS) ×3 IMPLANT
RETRACTOR WND ALEXIS 18 MED (MISCELLANEOUS) ×1 IMPLANT
RTRCTR WOUND ALEXIS 18CM MED (MISCELLANEOUS) ×3
STEM CORAIL KLA14 (Stem) ×2 IMPLANT
SUT ETHIBOND NAB CT1 #1 30IN (SUTURE) ×6 IMPLANT
SUT VIC AB 1 CT1 27 (SUTURE) ×3
SUT VIC AB 1 CT1 27XBRD ANBCTR (SUTURE) ×1 IMPLANT
SUT VIC AB 2-0 CT1 27 (SUTURE) ×3
SUT VIC AB 2-0 CT1 TAPERPNT 27 (SUTURE) ×1 IMPLANT
SUT VIC AB 3-0 PS2 18 (SUTURE) ×3
SUT VIC AB 3-0 PS2 18XBRD (SUTURE) ×1 IMPLANT
SUT VLOC 180 0 24IN GS25 (SUTURE) ×3 IMPLANT
SYR 50ML LL SCALE MARK (SYRINGE) ×3 IMPLANT

## 2018-02-03 NOTE — Op Note (Signed)

## 2018-02-03 NOTE — Progress Notes (Signed)
Pt stated he felt nauseous. HR then dropped to the 30's and went as low as 28. Lowered HOB and HR increased to 40's and low 50's. BP 83/55. Called Dr. Nyoka Cowden to bedside. She ordered Robinul and Albumin and 279mL fluid bolus.   Pt stated that he felt better after all of this, HR in 70's BP 100/74.

## 2018-02-03 NOTE — Anesthesia Postprocedure Evaluation (Signed)
Anesthesia Post Note  Patient: [name]  Procedure(s) Performed: TOTAL HIP ARTHROPLASTY ANTERIOR APPROACH (Right Hip)     Patient location during evaluation: PACU Anesthesia Type: Spinal Level of consciousness: awake and alert Pain management: pain level controlled Vital Signs Assessment: post-procedure vital signs reviewed and stable Respiratory status: spontaneous breathing and respiratory function stable Cardiovascular status: blood pressure returned to baseline and stable Postop Assessment: spinal receding and no apparent nausea or vomiting Anesthetic complications: no    Last Vitals:  Vitals:   02/03/18 1823 02/03/18 2016  BP: 132/72 126/79  Pulse: 69 72  Resp: 18 17  Temp: 36.6 C 36.9 C  SpO2: 98% 93%    Last Pain:  Vitals:   02/03/18 2016  TempSrc: Oral  PainSc:                  [name]

## 2018-02-03 NOTE — Transfer of Care (Signed)
Immediate Anesthesia Transfer of Care Note  Patient: [name]  Procedure(s) Performed: TOTAL HIP ARTHROPLASTY ANTERIOR APPROACH (Right Hip)  Patient Location: PACU  Anesthesia Type:MAC and Spinal  Level of Consciousness: awake, alert , oriented and patient cooperative  Airway & Oxygen Therapy: Patient Spontanous Breathing and Patient connected to nasal cannula oxygen  Post-op Assessment: Report given to RN and Post -op Vital signs reviewed and stable  Post vital signs: Reviewed and stable  Last Vitals:  Vitals Value Taken Time  BP 88/62 02/03/2018  2:17 PM  Temp 36.1 C 02/03/2018  2:15 PM  Pulse 63 02/03/2018  2:20 PM  Resp 12 02/03/2018  2:20 PM  SpO2 94 % 02/03/2018  2:20 PM  Vitals shown include unvalidated device data.  Last Pain:  Vitals:   02/03/18 1415  TempSrc:   PainSc: 0-No pain         Complications: No apparent anesthesia complications

## 2018-02-03 NOTE — Progress Notes (Signed)
Called Dr. Rhona Raider to update him on bradycardic episode. Mitzi Hansen PA answered the phone and updated him. He stated he would call Dr. Rhona Raider and let him know and that he would be in hospital to round in about an hour.

## 2018-02-03 NOTE — Progress Notes (Signed)
Pt. States he drank 4oz. Water @ 0900. Dr. Roanna Banning notified.  Per Dr. Roanna Banning cannot have anesthesia until 1100.

## 2018-02-03 NOTE — Anesthesia Preprocedure Evaluation (Addendum)
Anesthesia Evaluation  Patient identified by MRN, date of birth, ID band Patient awake    Reviewed: Allergy & Precautions, NPO status , Patient's Chart, lab work & pertinent test results  Airway Mallampati: II  TM Distance: >3 FB Neck ROM: Full    Dental  (+) Dental Advisory Given, Teeth Intact   Pulmonary former smoker,    breath sounds clear to auscultation       Cardiovascular hypertension, Pt. on medications (-) angina Rhythm:Regular Rate:Normal  ECG: NSR, LAD, rate 76   Neuro/Psych negative neurological ROS  negative psych ROS   GI/Hepatic negative GI ROS, Neg liver ROS,   Endo/Other  Morbid obesity Pre-diabetes    Renal/GU negative Renal ROS    ED     Musculoskeletal  (+) Arthritis , Osteoarthritis,    Abdominal (+) + obese,   Peds  Hematology  HLD   Anesthesia Other Findings   Reproductive/Obstetrics                           Anesthesia Physical Anesthesia Plan  ASA: III  Anesthesia Plan: Spinal   Post-op Pain Management:    Induction:   PONV Risk Score and Plan: 1 and Treatment may vary due to age or medical condition and Propofol infusion  Airway Management Planned: Natural Airway and Simple Face Mask  Additional Equipment: None  Intra-op Plan:   Post-operative Plan:   Informed Consent: I have reviewed the patients History and Physical, chart, labs and discussed the procedure including the risks, benefits and alternatives for the proposed anesthesia with the patient or authorized representative who has indicated his/her understanding and acceptance.     Plan Discussed with: CRNA and Anesthesiologist  Anesthesia Plan Comments: (Labs reviewed. Platelets acceptable, patient not taking any blood thinning medications. Risks and benefits discussed with patient, patient expressed understanding and wished to proceed.)       Anesthesia Quick Evaluation

## 2018-02-03 NOTE — Anesthesia Procedure Notes (Signed)
Spinal  Patient location during procedure: OR Start time: 02/03/2018 12:09 PM End time: 02/03/2018 12:13 PM Staffing Anesthesiologist: [name], MD Performed: anesthesiologist  Preanesthetic Checklist Completed: patient identified, surgical consent, pre-op evaluation, timeout performed, IV checked, risks and benefits discussed and monitors and equipment checked Spinal Block Patient position: sitting Prep: DuraPrep Patient monitoring: heart rate, cardiac monitor, continuous pulse ox and blood pressure Approach: midline Location: L3-4 Injection technique: single-shot Needle Needle type: Pencan  Needle gauge: 24 G Additional Notes Functioning IV was confirmed and monitors were applied. Sterile prep and drape, including hand hygiene, mask, and sterile gloves were used. The patient was positioned and the spine was prepped. The skin was anesthetized with lidocaine. Free flow of clear CSF was obtained prior to injecting local anesthetic into the CSF. The spinal needle aspirated freely following injection. The needle was carefully withdrawn. The patient tolerated the procedure well. Consent was obtained prior to the procedure with all questions answered and concerns addressed. Risks including, but not limited to, bleeding, infection, nerve damage, paralysis, failed block, inadequate analgesia, allergic reaction, high spinal, itching, and headache were discussed and the patient wished to proceed.  Renold Don, MD

## 2018-02-03 NOTE — Interval H&P Note (Signed)
History and Physical Interval Note:  02/03/2018 11:54 AM  [name]  has presented today for surgery, with the diagnosis of OSTEOARTHRITIS RIGHT HIP  The various methods of treatment have been discussed with the patient and family. After consideration of risks, benefits and other options for treatment, the patient has consented to  Procedure(s): TOTAL HIP ARTHROPLASTY ANTERIOR APPROACH (Right) as a surgical intervention .  The patient's history has been reviewed, patient examined, no change in status, stable for surgery.  I have reviewed the patient's chart and labs.  Questions were answered to the patient's satisfaction.     [name]

## 2018-02-04 ENCOUNTER — Encounter (HOSPITAL_COMMUNITY): Payer: Self-pay | Admitting: Orthopaedic Surgery

## 2018-02-04 LAB — BASIC METABOLIC PANEL
Anion gap: 10 (ref 5–15)
BUN: 14 mg/dL (ref 8–23)
CO2: 25 mmol/L (ref 22–32)
CREATININE: 1.05 mg/dL (ref 0.61–1.24)
Calcium: 8.3 mg/dL — ABNORMAL LOW (ref 8.9–10.3)
Chloride: 98 mmol/L (ref 98–111)
GFR calc Af Amer: >60 mL/min (ref >60)
GFR calc non Af Amer: >60 mL/min (ref >60)
GLUCOSE: 139 mg/dL — AB (ref 70–99)
Potassium: 4 mmol/L (ref 3.5–5.1)
Sodium: 133 mmol/L — ABNORMAL LOW (ref 135–145)

## 2018-02-04 LAB — CBC
HCT: 36.3 % — ABNORMAL LOW (ref 39.0–52.0)
Hemoglobin: 12.2 g/dL — ABNORMAL LOW (ref 13.0–17.0)
MCH: 30.9 pg (ref 26.0–34.0)
MCHC: 33.6 g/dL (ref 30.0–36.0)
MCV: 91.9 fL (ref 78.0–100.0)
PLATELETS: 143 10*3/uL — AB (ref 150–400)
RBC: 3.95 MIL/uL — ABNORMAL LOW (ref 4.22–5.81)
RDW: 12.7 % (ref 11.5–15.5)
WBC: 7.5 10*3/uL (ref 4.0–10.5)

## 2018-02-04 NOTE — Progress Notes (Signed)
Subjective: 1 Day Post-Op Procedure(s) (LRB): TOTAL HIP ARTHROPLASTY ANTERIOR APPROACH (Right)   Patient doing well and is currently eating breakfast. He is looking forward to getting up.  Activity level:  wbat Diet tolerance:  ok Voiding:  ok Patient reports pain as mild and moderate.    Objective: Vital signs in last 24 hours: Temp:  [97 F (36.1 C)-98.4 F (36.9 C)] 98.3 F (36.8 C) (08/07 0418) Pulse Rate:  [30-82] 78 (08/07 0418) Resp:  [10-20] 17 (08/07 0418) BP: (60-132)/(49-82) 100/57 (08/07 0418) SpO2:  [93 %-100 %] 93 % (08/06 2016) Weight:  [119.9 kg (264 lb 4.8 oz)] 119.9 kg (264 lb 4.8 oz) (08/06 0946)  Labs: Recent Labs    02/04/18 0413  HGB 12.2*   Recent Labs    02/04/18 0413  WBC 7.5  RBC 3.95*  HCT 36.3*  PLT 143*   Recent Labs    02/04/18 0413  NA 133*  K 4.0  CL 98  CO2 25  BUN 14  CREATININE 1.05  GLUCOSE 139*  CALCIUM 8.3*   No results for input(s): LABPT, INR in the last 72 hours.  Physical Exam:  Neurologically intact ABD soft Neurovascular intact Sensation intact distally Intact pulses distally Dorsiflexion/Plantar flexion intact Incision: dressing C/D/I and no drainage No cellulitis present Compartment soft  Assessment/Plan:  1 Day Post-Op Procedure(s) (LRB): TOTAL HIP ARTHROPLASTY ANTERIOR APPROACH (Right) Advance diet Up with therapy Plan for discharge tomorrow Discharge home with home health if doing well and cleared by PT. Continue on ASA 325mg  BID x 4 weeks post op for DVT prevention Follow up in office 2 weeks post op.  Allan Minotti, Larwance Sachs 02/04/2018, 7:30 AM

## 2018-02-04 NOTE — Progress Notes (Signed)
Physical Therapy Treatment Patient Details Name: [name] MRN: [id number] DOB: [birth year] Today's Date: 02/04/2018    History of Present Illness Pt is a 72 y.o. M s/p right total hip arthroplasty.    PT Comments    Patient treatment limited by nausea during mobility. Ambulated 50 feet with walker and min guard assist. Rest of session focused on reviewing HEP for right hip and knee strengthening; written handout provided. Also reviewed progression of exercises. Will trial stairs in AM.    Follow Up Recommendations  Follow surgeon's recommendation for DC plan and follow-up therapies     Equipment Recommendations  None recommended by PT    Recommendations for Other Services       Precautions / Restrictions Precautions Precautions: Fall Restrictions Weight Bearing Restrictions: Yes RLE Weight Bearing: Weight bearing as tolerated    Mobility  Bed Mobility Overal bed mobility: Needs Assistance Bed Mobility: Supine to Sit     Supine to sit: Min guard     General bed mobility comments: no physical assistance required. min guard for safety  Transfers Overall transfer level: Needs assistance Equipment used: Rolling walker (2 wheeled) Transfers: Sit to/from Stand Sit to Stand: Supervision         General transfer comment: supervision for safety from elevated bed.   Ambulation/Gait Ambulation/Gait assistance: Min guard Gait Distance (Feet): 50 Feet Assistive device: Rolling walker (2 wheeled) Gait Pattern/deviations: Step-to pattern;Decreased step length - left;Antalgic;Decreased weight shift to right Gait velocity: decr Gait velocity interpretation: <1.31 ft/sec, indicative of household ambulator General Gait Details: Cues provided for step through pattern and equal step lengths but patient not able to achieve consistently.   Stairs             Wheelchair Mobility    Modified Rankin (Stroke Patients Only)       Balance Overall balance assessment:  Mild deficits observed, not formally tested                                          Cognition Arousal/Alertness: Awake/alert Behavior During Therapy: WFL for tasks assessed/performed Overall Cognitive Status: Within Functional Limits for tasks assessed                                        Exercises Total Joint Exercises Heel Slides: 10 reps;Right;Supine Hip ABduction/ADduction: 10 reps;Right;Supine Long Arc Quad: 10 reps;Right;Supine    General Comments        Pertinent Vitals/Pain Pain Assessment: Faces Faces Pain Scale: Hurts even more Pain Location: right hip Pain Descriptors / Indicators: Aching;Grimacing;Guarding Pain Intervention(s): Limited activity within patient's tolerance;Monitored during session    Home Living                      Prior Function            PT Goals (current goals can now be found in the care plan section) Acute Rehab PT Goals Patient Stated Goal: "get out of bed" PT Goal Formulation: With patient Time For Goal Achievement: 02/09/18 Potential to Achieve Goals: Good    Frequency    7X/week      PT Plan Current plan remains appropriate    Co-evaluation              AM-PAC PT "6  Clicks" Daily Activity  Outcome Measure  Difficulty turning over in bed (including adjusting bedclothes, sheets and blankets)?: None Difficulty moving from lying on back to sitting on the side of the bed? : A Little Difficulty sitting down on and standing up from a chair with arms (e.g., wheelchair, bedside commode, etc,.)?: A Little Help needed moving to and from a bed to chair (including a wheelchair)?: A Little Help needed walking in hospital room?: A Little Help needed climbing 3-5 steps with a railing? : A Little 6 Click Score: 19    End of Session Equipment Utilized During Treatment: Gait belt Activity Tolerance: Other (comment)(limited by nausea) Patient left: with call bell/phone within  reach;with family/visitor present;in bed Nurse Communication: Mobility status PT Visit Diagnosis: Muscle weakness (generalized) (M62.81);Difficulty in walking, not elsewhere classified (R26.2);Pain Pain - Right/Left: Right Pain - part of body: Hip     Time: 1555-1640 PT Time Calculation (min) (ACUTE ONLY): 45 min  Charges:  $Gait Training: 23-37 mins $Therapeutic Exercise: 8-22 mins                     Ellamae Sia, PT, DPT Acute Rehabilitation Services  Pager: [place] 02/04/2018, 4:57 PM

## 2018-02-04 NOTE — Plan of Care (Signed)

## 2018-02-04 NOTE — Evaluation (Signed)
Physical Therapy Evaluation Patient Details Name: [name] MRN: [id number] DOB: 04/29/1946 Today's Date: 02/04/2018   History of Present Illness  Pt is a 72 y.o. M s/p right total hip arthroplasty.  Clinical Impression  Pt is s/p THA resulting in the deficits listed below (see PT Problem List). Patient independent at baseline. Currently with decreased functional mobility secondary to RLE weakness, pain, and diminished activity tolerance. On PT evaluation, ambulating 80 feet with walker and min guard assist. Fatigues easily due to increased reliance with arms on walker. Written HEP provided. Will follow acutely to progress strengthening, gait training, and stair training.      Follow Up Recommendations Follow surgeon's recommendation for DC plan and follow-up therapies    Equipment Recommendations  None recommended by PT    Recommendations for Other Services       Precautions / Restrictions Precautions Precautions: Fall Restrictions Weight Bearing Restrictions: Yes RLE Weight Bearing: Weight bearing as tolerated      Mobility  Bed Mobility Overal bed mobility: Needs Assistance Bed Mobility: Supine to Sit     Supine to sit: Min assist     General bed mobility comments: light min assist with RLE management  Transfers Overall transfer level: Needs assistance Equipment used: Rolling walker (2 wheeled) Transfers: Sit to/from Stand Sit to Stand: Supervision         General transfer comment: supervision for safety from elevated bed. cues for hand placement  Ambulation/Gait Ambulation/Gait assistance: Min guard Gait Distance (Feet): 80 Feet Assistive device: Rolling walker (2 wheeled) Gait Pattern/deviations: Step-to pattern;Decreased step length - left;Antalgic;Decreased weight shift to right Gait velocity: decr Gait velocity interpretation: <1.31 ft/sec, indicative of household ambulator General Gait Details: Cues provided for sequencing and adequate right step  length. Patient with mod-heavy reliance through BUE's; encouraged to increase weightbearing through RLE as tolerated  Stairs            Wheelchair Mobility    Modified Rankin (Stroke Patients Only)       Balance Overall balance assessment: Mild deficits observed, not formally tested                                           Pertinent Vitals/Pain Pain Assessment: 0-10 Pain Score: 3  Pain Location: right hip Pain Descriptors / Indicators: Aching;Grimacing;Guarding Pain Intervention(s): Limited activity within patient's tolerance;Monitored during session    Home Living Family/patient expects to be discharged to:: Private residence Living Arrangements: Spouse/significant other Available Help at Discharge: Family Type of Home: House Home Access: Stairs to enter Entrance Stairs-Rails: None Entrance Stairs-Number of Steps: 4 Home Layout: Two level Home Equipment: Environmental consultant - 2 wheels;Toilet riser;Tub bench      Prior Function Level of Independence: Independent               Hand Dominance        Extremity/Trunk Assessment   Upper Extremity Assessment Upper Extremity Assessment: Overall WFL for tasks assessed    Lower Extremity Assessment Lower Extremity Assessment: RLE deficits/detail RLE Deficits / Details: s/p R THA    Cervical / Trunk Assessment Cervical / Trunk Assessment: Normal  Communication   Communication: No difficulties  Cognition Arousal/Alertness: Awake/alert Behavior During Therapy: WFL for tasks assessed/performed Overall Cognitive Status: Within Functional Limits for tasks assessed  General Comments General comments (skin integrity, edema, etc.): Bandage C/D/I    Exercises Total Joint Exercises Ankle Circles/Pumps: Both;10 reps;Seated Quad Sets: Both;Seated;5 reps   Assessment/Plan    PT Assessment Patient needs continued PT services  PT Problem List  Decreased strength;Decreased range of motion;Decreased activity tolerance;Decreased balance;Decreased mobility;Pain       PT Treatment Interventions DME instruction;Gait training;Stair training;Functional mobility training;Therapeutic activities;Therapeutic exercise;Balance training;Patient/family education    PT Goals (Current goals can be found in the Care Plan section)  Acute Rehab PT Goals Patient Stated Goal: "get out of bed" PT Goal Formulation: With patient Time For Goal Achievement: 02/09/18 Potential to Achieve Goals: Good    Frequency 7X/week   Barriers to discharge        Co-evaluation               AM-PAC PT "6 Clicks" Daily Activity  Outcome Measure Difficulty turning over in bed (including adjusting bedclothes, sheets and blankets)?: None Difficulty moving from lying on back to sitting on the side of the bed? : A Little Difficulty sitting down on and standing up from a chair with arms (e.g., wheelchair, bedside commode, etc,.)?: A Little Help needed moving to and from a bed to chair (including a wheelchair)?: A Little Help needed walking in hospital room?: A Little Help needed climbing 3-5 steps with a railing? : A Little 6 Click Score: 19    End of Session Equipment Utilized During Treatment: Gait belt Activity Tolerance: Patient tolerated treatment well Patient left: in chair;with call bell/phone within reach;with family/visitor present Nurse Communication: Mobility status PT Visit Diagnosis: Muscle weakness (generalized) (M62.81);Difficulty in walking, not elsewhere classified (R26.2);Pain Pain - Right/Left: Right Pain - part of body: Hip    Time: 3570-1779 PT Time Calculation (min) (ACUTE ONLY): 41 min   Charges:   PT Evaluation $PT Eval Low Complexity: 1 Low PT Treatments $Gait Training: 23-37 mins        Ellamae Sia, PT, DPT Acute Rehabilitation Services  Pager: [place] 02/04/2018, 11:13 AM

## 2018-02-05 ENCOUNTER — Other Ambulatory Visit: Payer: Self-pay

## 2018-02-05 ENCOUNTER — Encounter (HOSPITAL_COMMUNITY): Payer: Self-pay | Admitting: General Practice

## 2018-02-05 LAB — CBC
HCT: 38.3 % — ABNORMAL LOW (ref 39.0–52.0)
Hemoglobin: 12.6 g/dL — ABNORMAL LOW (ref 13.0–17.0)
MCH: 30 pg (ref 26.0–34.0)
MCHC: 32.9 g/dL (ref 30.0–36.0)
MCV: 91.2 fL (ref 78.0–100.0)
PLATELETS: 135 10*3/uL — AB (ref 150–400)
RBC: 4.2 MIL/uL — AB (ref 4.22–5.81)
RDW: 12.8 % (ref 11.5–15.5)
WBC: 8.6 10*3/uL (ref 4.0–10.5)

## 2018-02-05 MED ORDER — HYDROCODONE-ACETAMINOPHEN 7.5-325 MG PO TABS: 1.0000 | ORAL_TABLET | ORAL | 0 refills | Status: DC | PRN

## 2018-02-05 MED ORDER — BISACODYL 5 MG PO TBEC: 5.0000 mg | DELAYED_RELEASE_TABLET | Freq: Every day | ORAL | 0 refills | Status: DC | PRN

## 2018-02-05 MED ORDER — TIZANIDINE HCL 4 MG PO TABS: 4.0000 mg | ORAL_TABLET | Freq: Four times a day (QID) | ORAL | 1 refills | Status: AC | PRN

## 2018-02-05 MED ORDER — DOCUSATE SODIUM 100 MG PO CAPS: 100.0000 mg | ORAL_CAPSULE | Freq: Two times a day (BID) | ORAL | 0 refills | Status: DC

## 2018-02-05 MED ORDER — ASPIRIN 325 MG PO TBEC: 325.0000 mg | DELAYED_RELEASE_TABLET | Freq: Two times a day (BID) | ORAL | 0 refills | Status: DC

## 2018-02-05 NOTE — Progress Notes (Signed)
Physical Therapy Treatment Patient Details Name: [name] MRN: [id number] DOB: 07-Sep-1945 Today's Date: 02/05/2018    History of Present Illness Pt is a 72 y.o. M s/p right total hip arthroplasty.    PT Comments    Patient able to progress to stair training this session. Negotiated 3 steps with walker backwards during ascent and forwards during descent with min guard assist. Pt wife present and participated in stabilizing walker with verbal instruction/demonstrating from PT provided with good carryover. Pt increased ambulation distance to 130 feet using walker and able to utilize step through pattern with cueing.    Follow Up Recommendations  Follow surgeon's recommendation for DC plan and follow-up therapies     Equipment Recommendations  None recommended by PT    Recommendations for Other Services       Precautions / Restrictions Precautions Precautions: Fall Restrictions Weight Bearing Restrictions: Yes RLE Weight Bearing: Weight bearing as tolerated    Mobility  Bed Mobility Overal bed mobility: Needs Assistance Bed Mobility: Supine to Sit     Supine to sit: Supervision     General bed mobility comments: no physical assistance required. supervision for safety  Transfers Overall transfer level: Needs assistance Equipment used: Rolling walker (2 wheeled) Transfers: Sit to/from Stand Sit to Stand: Supervision         General transfer comment: supervision for safety from elevated bed and recliner. good technique and hand placement  Ambulation/Gait Ambulation/Gait assistance: Supervision Gait Distance (Feet): 130 Feet Assistive device: Rolling walker (2 wheeled) Gait Pattern/deviations: Step-to pattern;Decreased step length - left;Antalgic;Decreased weight shift to right;Step-through pattern Gait velocity: decr   General Gait Details: Able to progress to step through pattern with cueing and increased gait speed. Continues with antalgic  pattern.   Stairs Stairs: Yes Stairs assistance: Min guard Stair Management: With walker Number of Stairs: 3 General stair comments: Negotiated 3 steps using walker backwards ascending and forwards descending. Pt wife present and participated in stabilizing walker.   Wheelchair Mobility    Modified Rankin (Stroke Patients Only)       Balance Overall balance assessment: Mild deficits observed, not formally tested                                          Cognition Arousal/Alertness: Awake/alert Behavior During Therapy: WFL for tasks assessed/performed Overall Cognitive Status: Within Functional Limits for tasks assessed                                        Exercises      General Comments        Pertinent Vitals/Pain Pain Assessment: Faces Faces Pain Scale: Hurts even more Pain Location: right hip Pain Descriptors / Indicators: Aching;Grimacing;Guarding Pain Intervention(s): Monitored during session;Premedicated before session    Home Living                      Prior Function            PT Goals (current goals can now be found in the care plan section) Acute Rehab PT Goals Patient Stated Goal: "get out of bed" PT Goal Formulation: With patient Time For Goal Achievement: 02/09/18 Potential to Achieve Goals: Good Progress towards PT goals: Progressing toward goals    Frequency    7X/week  PT Plan Current plan remains appropriate    Co-evaluation              AM-PAC PT "6 Clicks" Daily Activity  Outcome Measure  Difficulty turning over in bed (including adjusting bedclothes, sheets and blankets)?: None Difficulty moving from lying on back to sitting on the side of the bed? : A Little Difficulty sitting down on and standing up from a chair with arms (e.g., wheelchair, bedside commode, etc,.)?: A Little Help needed moving to and from a bed to chair (including a wheelchair)?: A Little Help  needed walking in hospital room?: A Little Help needed climbing 3-5 steps with a railing? : A Little 6 Click Score: 19    End of Session Equipment Utilized During Treatment: Gait belt Activity Tolerance: Patient tolerated treatment well Patient left: with call bell/phone within reach;with family/visitor present;in chair Nurse Communication: Mobility status PT Visit Diagnosis: Muscle weakness (generalized) (M62.81);Difficulty in walking, not elsewhere classified (R26.2);Pain Pain - Right/Left: Right Pain - part of body: Hip     Time: 2409-7353 PT Time Calculation (min) (ACUTE ONLY): 27 min  Charges:  $Gait Training: 8-22 mins $Therapeutic Activity: 8-22 mins                     Ellamae Sia, PT, DPT Acute Rehabilitation Services  Pager: [place] 02/05/2018, 4:55 PM

## 2018-02-05 NOTE — Care Management Note (Signed)
Case Management Note  Patient Details  Name: [name] MRN: [id number] Date of Birth: 06/05/1946  Subjective/Objective:  72 yr old gentleman s/p right total hip arthroplasty.                  Action/Plan: Case manager spoke with patient and his wife concerning discharge plan. Patient has [name], [name], Dr. Jerald Kief surgery scheduler, has sent referral for Home Health to [facility], they will assign a [facility] for patient. [name] says he has received a call from someone, he doesn't remember who. He will have family support at discharge.     Expected Discharge Date:   02/06/18               Expected Discharge Plan:  [place]  In-House Referral:  NA  Discharge planning Services  CM Consult  Post Acute Care Choice:  Home Health Choice offered to:  Patient, Spouse  DME Arranged:  Hospital bed(has DME, ) DME Agency:  (to be arranged by [facility])  HH Arranged:  PT HH Agency:  (to be arranged by [facility])  Status of Service:  Completed, signed off  If discussed at [place] of Stay Meetings, dates discussed:    Additional Comments:  Ninfa Meeker, RN 02/05/2018, 1:50 PM

## 2018-02-05 NOTE — Progress Notes (Signed)
Physical Therapy Treatment Patient Details Name: [name] MRN: [id number] DOB: 12-18-1945 Today's Date: 02/05/2018    History of Present Illness Pt is a 72 y.o. M s/p right total hip arthroplasty.    PT Comments    Patient progressing slowly towards goals. Increased ambulation distance to 100 feet with walker and supervision. Still utilizing a step to pattern and relying heavily through arms on walker. Continues to be limited by hip pain (premedicated prior to session) and fatigues easily. Pt deferring stair training to afternoon session.    Follow Up Recommendations  Follow surgeon's recommendation for DC plan and follow-up therapies     Equipment Recommendations  None recommended by PT    Recommendations for Other Services       Precautions / Restrictions Precautions Precautions: Fall Restrictions Weight Bearing Restrictions: Yes RLE Weight Bearing: Weight bearing as tolerated    Mobility  Bed Mobility Overal bed mobility: Needs Assistance Bed Mobility: Supine to Sit     Supine to sit: Supervision     General bed mobility comments: no physical assistance required. supervision for safety  Transfers Overall transfer level: Needs assistance Equipment used: Rolling walker (2 wheeled) Transfers: Sit to/from Stand Sit to Stand: Supervision         General transfer comment: supervision for safety from elevated bed.   Ambulation/Gait Ambulation/Gait assistance: Supervision Gait Distance (Feet): 100 Feet Assistive device: Rolling walker (2 wheeled) Gait Pattern/deviations: Step-to pattern;Decreased step length - left;Antalgic;Decreased weight shift to right Gait velocity: decr Gait velocity interpretation: <1.31 ft/sec, indicative of household ambulator General Gait Details: Cues for decreased right pelvic rotation and squaring hips to promote adequate hip flexion for swing through phase. Patient still incorporating a step through pattern with heavy reliance on  BUE's. Able to slightly increase gait speed towards end of ambulation.   Stairs             Wheelchair Mobility    Modified Rankin (Stroke Patients Only)       Balance Overall balance assessment: Mild deficits observed, not formally tested                                          Cognition Arousal/Alertness: Awake/alert Behavior During Therapy: WFL for tasks assessed/performed Overall Cognitive Status: Within Functional Limits for tasks assessed                                        Exercises Total Joint Exercises Ankle Circles/Pumps: 10 reps;Both;Supine Towel Squeeze: Seated;10 reps Long Arc Quad: Supine;15 reps;Both    General Comments        Pertinent Vitals/Pain Pain Assessment: Faces Faces Pain Scale: Hurts even more Pain Location: right hip Pain Descriptors / Indicators: Aching;Grimacing;Guarding Pain Intervention(s): Limited activity within patient's tolerance;Monitored during session    Home Living                      Prior Function            PT Goals (current goals can now be found in the care plan section) Acute Rehab PT Goals Patient Stated Goal: "get out of bed" PT Goal Formulation: With patient Time For Goal Achievement: 02/09/18 Potential to Achieve Goals: Good Progress towards PT goals: Progressing toward goals    Frequency  7X/week      PT Plan Current plan remains appropriate    Co-evaluation              AM-PAC PT "6 Clicks" Daily Activity  Outcome Measure  Difficulty turning over in bed (including adjusting bedclothes, sheets and blankets)?: None Difficulty moving from lying on back to sitting on the side of the bed? : A Little Difficulty sitting down on and standing up from a chair with arms (e.g., wheelchair, bedside commode, etc,.)?: A Little Help needed moving to and from a bed to chair (including a wheelchair)?: A Little Help needed walking in hospital room?: A  Little Help needed climbing 3-5 steps with a railing? : A Little 6 Click Score: 19    End of Session Equipment Utilized During Treatment: Gait belt Activity Tolerance: Patient tolerated treatment well Patient left: with call bell/phone within reach;with family/visitor present;in chair Nurse Communication: Mobility status PT Visit Diagnosis: Muscle weakness (generalized) (M62.81);Difficulty in walking, not elsewhere classified (R26.2);Pain Pain - Right/Left: Right Pain - part of body: Hip     Time: 3475-8307 PT Time Calculation (min) (ACUTE ONLY): 23 min  Charges:  $Gait Training: 23-37 mins                     Ellamae Sia, PT, DPT Acute Rehabilitation Services  Pager: [phone]    Willy Eddy 02/05/2018, 9:20 AM

## 2018-02-05 NOTE — Plan of Care (Signed)

## 2018-02-06 LAB — CBC
HCT: 35.5 % — ABNORMAL LOW (ref 39.0–52.0)
HEMOGLOBIN: 11.7 g/dL — AB (ref 13.0–17.0)
MCH: 30.2 pg (ref 26.0–34.0)
MCHC: 33 g/dL (ref 30.0–36.0)
MCV: 91.7 fL (ref 78.0–100.0)
PLATELETS: 132 10*3/uL — AB (ref 150–400)
RBC: 3.87 MIL/uL — AB (ref 4.22–5.81)
RDW: 12.8 % (ref 11.5–15.5)
WBC: 7.5 10*3/uL (ref 4.0–10.5)

## 2018-02-06 NOTE — Progress Notes (Signed)
Subjective: 3 Days Post-Op Procedure(s) (LRB): TOTAL HIP ARTHROPLASTY ANTERIOR APPROACH (Right)   Patient continues to do well and is ready to go home today. A hospital bed is scheduled to be delivered.  Activity level:  wbat Diet tolerance:  ok Voiding:  ok Patient reports pain as mild.    Objective: Vital signs in last 24 hours: Temp:  [98 F (36.7 C)-98.6 F (37 C)] 98.6 F (37 C) (08/09 0425) Pulse Rate:  [74-86] 74 (08/09 0425) Resp:  [14-18] 18 (08/09 0425) BP: (99-146)/(66-76) 99/66 (08/09 0425) SpO2:  [96 %-98 %] 96 % (08/09 0425)  Labs: Recent Labs    02/04/18 0413 02/05/18 0355 02/06/18 0406  HGB 12.2* 12.6* 11.7*   Recent Labs    02/05/18 0355 02/06/18 0406  WBC 8.6 7.5  RBC 4.20* 3.87*  HCT 38.3* 35.5*  PLT 135* 132*   Recent Labs    02/04/18 0413  NA 133*  K 4.0  CL 98  CO2 25  BUN 14  CREATININE 1.05  GLUCOSE 139*  CALCIUM 8.3*   No results for input(s): LABPT, INR in the last 72 hours.  Physical Exam:  Neurologically intact ABD soft Neurovascular intact Sensation intact distally Intact pulses distally Dorsiflexion/Plantar flexion intact Incision: dressing C/D/I and no drainage No cellulitis present Compartment soft  Assessment/Plan:  3 Days Post-Op Procedure(s) (LRB): TOTAL HIP ARTHROPLASTY ANTERIOR APPROACH (Right) Advance diet Up with therapy Discharge home with home health today.  Continue on ASA 325mg  BID x 4 weeks post op. Follow up in office 2 weeks post op.  [name], [place] Sachs 02/06/2018, 7:31 AM

## 2018-02-06 NOTE — Discharge Summary (Signed)
Patient ID: [name] MRN: [id number] DOB/AGE: Mar 15, 1946 72 y.o.  Admit date: 02/03/2018 Discharge date: 02/06/2018  Admission Diagnoses:  Principal Problem:   Primary osteoarthritis of right hip   Discharge Diagnoses:  Same  Past Medical History:  Diagnosis Date  . ED (erectile dysfunction)    takes Viagra as needed  . History of bronchitis last time about 65yrs ago  . Hyperlipidemia    takes Simvastatin daily  . Hypertension    takes Lisinopril daily  . Joint pain   . Osteoarthritis    knee  . Osteoarthritis of right hip   . Pre-diabetes    Takes Metformin PRN    Surgeries: Procedure(s): TOTAL HIP ARTHROPLASTY ANTERIOR APPROACH on 02/03/2018   Consultants:   Discharged Condition: Improved  Hospital Course: [name] is an 72 y.o. male who was admitted 02/03/2018 for operative treatment ofPrimary osteoarthritis of right hip. Patient has severe unremitting pain that affects sleep, daily activities, and work/hobbies. After pre-op clearance the patient was taken to the operating room on 02/03/2018 and underwent  Procedure(s): TOTAL HIP ARTHROPLASTY ANTERIOR APPROACH.    Patient was given perioperative antibiotics:  Anti-infectives (From admission, onward)   Start     Dose/Rate Route Frequency Ordered Stop   02/03/18 1930  ceFAZolin (ANCEF) IVPB 2g/100 mL premix     2 g 200 mL/hr over 30 Minutes Intravenous Every 6 hours 02/03/18 1822 02/04/18 0210   02/03/18 1100  ceFAZolin (ANCEF) 3 g in dextrose 5 % 50 mL IVPB     3 g 100 mL/hr over 30 Minutes Intravenous To ShortStay [facility] 02/02/18 0833 02/03/18 1215       Patient was given sequential compression devices, early ambulation, and chemoprophylaxis to prevent DVT.  Patient benefited maximally from hospital stay and there were no complications.    Recent vital signs:  Patient Vitals for the past 24 hrs:  BP Temp Temp src Pulse Resp SpO2  02/06/18 0425 99/66 98.6 F (37 C) Oral 74 18 96 %  02/05/18 2012 (!)  146/76 98 F (36.7 C) Oral 86 18 98 %  02/05/18 1320 110/66 98.4 F (36.9 C) Oral 78 14 97 %     Recent laboratory studies:  Recent Labs    02/04/18 0413 02/05/18 0355 02/06/18 0406  WBC 7.5 8.6 7.5  HGB 12.2* 12.6* 11.7*  HCT 36.3* 38.3* 35.5*  PLT 143* 135* 132*  NA 133*  --   --   K 4.0  --   --   CL 98  --   --   CO2 25  --   --   BUN 14  --   --   CREATININE 1.05  --   --   GLUCOSE 139*  --   --   CALCIUM 8.3*  --   --      Discharge Medications:   Allergies as of 02/06/2018   No Known Allergies     Medication List    STOP taking these medications   naproxen sodium 220 MG tablet Commonly known as:  ALEVE     TAKE these medications   aspirin 325 MG EC tablet Take 1 tablet (325 mg total) by mouth 2 (two) times daily after a meal. What changed:    medication strength  how much to take  when to take this   bisacodyl 5 MG EC tablet Commonly known as:  DULCOLAX Take 1 tablet (5 mg total) by mouth daily as needed for moderate constipation.  cholecalciferol 1000 units tablet Commonly known as:  VITAMIN D Take 1,000 Units by mouth at bedtime.   docusate sodium 100 MG capsule Commonly known as:  COLACE Take 1 capsule (100 mg total) by mouth 2 (two) times daily.   Fish Oil 500 MG Caps Take 500 mg by mouth at bedtime.   HYDROcodone-acetaminophen 7.5-325 MG tablet Commonly known as:  NORCO Take 1-2 tablets by mouth every 4 (four) hours as needed for severe pain (pain score 7-10).   lisinopril 10 MG tablet Commonly known as:  PRINIVIL,ZESTRIL Take 10 mg by mouth at bedtime.   metFORMIN 500 MG tablet Commonly known as:  GLUCOPHAGE Take 500 mg by mouth daily as needed (For Elevated BS. Pt takes depending on the meal eaten).   multivitamin with minerals Tabs tablet Take 1 tablet by mouth daily.   simvastatin 20 MG tablet Commonly known as:  ZOCOR Take 20 mg by mouth at bedtime.   tiZANidine 4 MG tablet Commonly known as:  ZANAFLEX Take 1 tablet  (4 mg total) by mouth every 6 (six) hours as needed.   vitamin B-12 1000 MCG tablet Commonly known as:  CYANOCOBALAMIN Take 1,000 mcg by mouth daily.   Vitamin E 400 units Tabs Take 400 Units by mouth at bedtime.            Durable Medical Equipment  (From admission, onward)         Start     Ordered   02/03/18 1823  DME Walker rolling  Once    Question:  Patient needs a walker to treat with the following condition  Answer:  Primary osteoarthritis of right hip   02/03/18 1822   02/03/18 1823  DME 3 n 1  Once     02/03/18 1822   02/03/18 1823  DME Bedside commode  Once    Question:  Patient needs a bedside commode to treat with the following condition  Answer:  Primary osteoarthritis of right hip   02/03/18 1822          Diagnostic Studies: Dg Chest 2 View  Result Date: 01/29/2018 CLINICAL DATA:  Preop EXAM: CHEST - 2 VIEW COMPARISON:  10/27/2014 FINDINGS: The heart is upper normal in size. Lungs are under aerated with basilar atelectasis. No pneumothorax. No pleural effusion. IMPRESSION: No active cardiopulmonary disease. Electronically Signed   By: Marybelle Killings M.D.   On: 01/29/2018 13:09   Dg C-arm 1-60 Min  Result Date: 02/03/2018 CLINICAL DATA:  Status post anterior approach right total hip arthroplasty. EXAM: OPERATIVE right HIP (WITH PELVIS IF PERFORMED) 4 VIEWS TECHNIQUE: Fluoroscopic spot image(s) were submitted for interpretation post-operatively. COMPARISON:  AP pelvis dated Nov 13, 2014. FINDINGS: Reported fluoro time is 34 seconds. The patient has undergone anterior approach right total hip joint prosthesis placement. Radiographic positioning of the prosthetic components is good. The interface with the native bone appears normal. IMPRESSION: There is no immediate postprocedure complication following right total hip joint prosthesis placement. Electronically Signed   By: David  Martinique M.D.   On: 02/03/2018 13:47   Dg Hip Operative Unilat W Or W/o Pelvis  Right  Result Date: 02/03/2018 CLINICAL DATA:  Status post anterior approach right total hip arthroplasty. EXAM: OPERATIVE right HIP (WITH PELVIS IF PERFORMED) 4 VIEWS TECHNIQUE: Fluoroscopic spot image(s) were submitted for interpretation post-operatively. COMPARISON:  AP pelvis dated Nov 13, 2014. FINDINGS: Reported fluoro time is 34 seconds. The patient has undergone anterior approach right total hip joint prosthesis placement. Radiographic positioning of  the prosthetic components is good. The interface with the native bone appears normal. IMPRESSION: There is no immediate postprocedure complication following right total hip joint prosthesis placement. Electronically Signed   By: David  Martinique M.D.   On: 02/03/2018 13:47    Disposition: Discharge disposition: 01-Home or Self Care       Discharge Instructions    Call MD / Call 911   Complete by:  As directed    If you experience chest pain or shortness of breath, CALL 911 and be transported to the hospital emergency room.  If you develope a fever above 101 F, pus (white drainage) or increased drainage or redness at the wound, or calf pain, call your surgeon's office.   Constipation Prevention   Complete by:  As directed    Drink plenty of fluids.  Prune juice may be helpful.  You may use a stool softener, such as Colace (over the counter) 100 mg twice a day.  Use MiraLax (over the counter) for constipation as needed.   Diet - low sodium heart healthy   Complete by:  As directed    Discharge instructions   Complete by:  As directed    INSTRUCTIONS AFTER JOINT REPLACEMENT   Remove items at home which could result in a fall. This includes throw rugs or furniture in walking pathways ICE to the affected joint every three hours while awake for 30 minutes at a time, for at least the first 3-5 days, and then as needed for pain and swelling.  Continue to use ice for pain and swelling. You may notice swelling that will progress down to the foot and  ankle.  This is normal after surgery.  Elevate your leg when you are not up walking on it.   Continue to use the breathing machine you got in the hospital (incentive spirometer) which will help keep your temperature down.  It is common for your temperature to cycle up and down following surgery, especially at night when you are not up moving around and exerting yourself.  The breathing machine keeps your lungs expanded and your temperature down.   DIET:  As you were doing prior to hospitalization, we recommend a well-balanced diet.  DRESSING / WOUND CARE / SHOWERING  You may shower 3 days after surgery, but keep the wounds dry during showering.  You may use an occlusive plastic wrap (Press'n Seal for example), NO SOAKING/SUBMERGING IN THE BATHTUB.  If the bandage gets wet, change with a clean dry gauze.  If the incision gets wet, pat the wound dry with a clean towel.  ACTIVITY  Increase activity slowly as tolerated, but follow the weight bearing instructions below.   No driving for 6 weeks or until further direction given by your physician.  You cannot drive while taking narcotics.  No lifting or carrying greater than 10 lbs. until further directed by your surgeon. Avoid periods of inactivity such as sitting longer than an hour when not asleep. This helps prevent blood clots.  You may return to work once you are authorized by your doctor.     WEIGHT BEARING   Weight bearing as tolerated with assist device (walker, cane, etc) as directed, use it as long as suggested by your surgeon or therapist, typically at least 4-6 weeks.   EXERCISES  Results after joint replacement surgery are often greatly improved when you follow the exercise, range of motion and muscle strengthening exercises prescribed by your doctor. Safety measures are also important to protect  the joint from further injury. Any time any of these exercises cause you to have increased pain or swelling, decrease what you are doing  until you are comfortable again and then slowly increase them. If you have problems or questions, call your caregiver or physical therapist for advice.   Rehabilitation is important following a joint replacement. After just a few days of immobilization, the muscles of the leg can become weakened and shrink (atrophy).  These exercises are designed to build up the tone and strength of the thigh and leg muscles and to improve motion. Often times heat used for twenty to thirty minutes before working out will loosen up your tissues and help with improving the range of motion but do not use heat for the first two weeks following surgery (sometimes heat can increase post-operative swelling).   These exercises can be done on a training (exercise) mat, on the floor, on a table or on a bed. Use whatever works the best and is most comfortable for you.    Use music or television while you are exercising so that the exercises are a pleasant break in your day. This will make your life better with the exercises acting as a break in your routine that you can look forward to.   Perform all exercises about fifteen times, three times per day or as directed.  You should exercise both the operative leg and the other leg as well.   Exercises include:   Quad Sets - Tighten up the muscle on the front of the thigh (Quad) and hold for 5-10 seconds.   Straight Leg Raises - With your knee straight (if you were given a brace, keep it on), lift the leg to 60 degrees, hold for 3 seconds, and slowly lower the leg.  Perform this exercise against resistance later as your leg gets stronger.  Leg Slides: Lying on your back, slowly slide your foot toward your buttocks, bending your knee up off the floor (only go as far as is comfortable). Then slowly slide your foot back down until your leg is flat on the floor again.  Angel Wings: Lying on your back spread your legs to the side as far apart as you can without causing discomfort.  Hamstring  Strength:  Lying on your back, push your heel against the floor with your leg straight by tightening up the muscles of your buttocks.  Repeat, but this time bend your knee to a comfortable angle, and push your heel against the floor.  You may put a pillow under the heel to make it more comfortable if necessary.   A rehabilitation program following joint replacement surgery can speed recovery and prevent re-injury in the future due to weakened muscles. Contact your doctor or a physical therapist for more information on knee rehabilitation.    CONSTIPATION  Constipation is defined medically as fewer than three stools per week and severe constipation as less than one stool per week.  Even if you have a regular bowel pattern at home, your normal regimen is likely to be disrupted due to multiple reasons following surgery.  Combination of anesthesia, postoperative narcotics, change in appetite and fluid intake all can affect your bowels.   YOU MUST use at least one of the following options; they are listed in order of increasing strength to get the job done.  They are all available over the counter, and you may need to use some, POSSIBLY even all of these options:    Drink plenty  of fluids (prune juice may be helpful) and high fiber foods Colace 100 mg by mouth twice a day  Senokot for constipation as directed and as needed Dulcolax (bisacodyl), take with full glass of water  Miralax (polyethylene glycol) once or twice a day as needed.  If you have tried all these things and are unable to have a bowel movement in the first 3-4 days after surgery call either your surgeon or your primary doctor.    If you experience loose stools or diarrhea, hold the medications until you stool forms back up.  If your symptoms do not get better within 1 week or if they get worse, check with your doctor.  If you experience "the worst abdominal pain ever" or develop nausea or vomiting, please contact the office immediately  for further recommendations for treatment.   ITCHING:  If you experience itching with your medications, try taking only a single pain pill, or even half a pain pill at a time.  You can also use Benadryl over the counter for itching or also to help with sleep.   TED HOSE STOCKINGS:  Use stockings on both legs until for at least 2 weeks or as directed by physician office. They may be removed at night for sleeping.  MEDICATIONS:  See your medication summary on the "After Visit Summary" that nursing will review with you.  You may have some home medications which will be placed on hold until you complete the course of blood thinner medication.  It is important for you to complete the blood thinner medication as prescribed.  PRECAUTIONS:  If you experience chest pain or shortness of breath - call 911 immediately for transfer to the hospital emergency department.   If you develop a fever greater that 101 F, purulent drainage from wound, increased redness or drainage from wound, foul odor from the wound/dressing, or calf pain - CONTACT YOUR SURGEON.                                                   FOLLOW-UP APPOINTMENTS:  If you do not already have a post-op appointment, please call the office for an appointment to be seen by your surgeon.  Guidelines for how soon to be seen are listed in your "After Visit Summary", but are typically between 1-4 weeks after surgery.  OTHER INSTRUCTIONS:   Knee Replacement:  Do not place pillow under knee, focus on keeping the knee straight while resting. CPM instructions: 0-90 degrees, 2 hours in the morning, 2 hours in the afternoon, and 2 hours in the evening. Place foam block, curve side up under heel at all times except when in CPM or when walking.  DO NOT modify, tear, cut, or change the foam block in any way.  MAKE SURE YOU:  Understand these instructions.  Get help right away if you are not doing well or get worse.    Thank you for letting us be a part of  your medical care team.  It is a privilege we respect greatly.  We hope these instructions will help you stay on track for a fast and full recovery!   Increase activity slowly as tolerated   Complete by:  As directed       Follow-up Information    Melrose Nakayama, MD. Schedule an appointment as soon as possible for a  visit in 2 weeks.   Specialty:  Orthopedic Surgery Contact information: [place] [address] [phone]        [facility] Follow up.   Why:  A representative from the assigned [facility] agency will contact you to arrange start date and time for your therapy.           Signed: Rich Fuchs 02/06/2018, 7:34 AM

## 2018-02-06 NOTE — Progress Notes (Signed)
AVS given and reviewed with pt and pt's wife. Printed prescriptions provided. All questions answered to satisfaction. Pt stated he has all equipment at home. Pt escorted off the unit via wheelchair by volunteer services.

## 2018-02-06 NOTE — Plan of Care (Signed)
  Problem: Clinical Measurements: Goal: Ability to maintain clinical measurements within normal limits will improve Outcome: Progressing   Problem: Pain Managment: Goal: General experience of comfort will improve Outcome: Progressing   Problem: Safety: Goal: Ability to remain free from injury will improve Outcome: Progressing   

## 2018-02-06 NOTE — Progress Notes (Signed)
CM met with the patient and he has 3 in 1 and walker at home. HH PT set up per [facility].  Per pt he is waiting on hospital bed to be delivered so his wife can pick him up from the hospital. CM notified bedside RN to let CM know if any issues per the patient with the bed. CM signing off.

## 2018-02-06 NOTE — Progress Notes (Signed)
Physical Therapy Treatment Patient Details Name: [name] MRN: [id number] DOB: April 19, 1946 Today's Date: 02/06/2018    History of Present Illness Pt is a 72 y.o. M s/p right total hip arthroplasty.    PT Comments    Patient with significant progress towards his physical therapy goals this session. Increased ambulation distance to 240 feet with walker and supervision, utilizing a consistent step through pattern and increased gait speed. Reviewed HEP and progression with patient. Patient with no further questions/concerns.    Follow Up Recommendations  Follow surgeon's recommendation for DC plan and follow-up therapies     Equipment Recommendations  None recommended by PT    Recommendations for Other Services       Precautions / Restrictions Precautions Precautions: Fall Restrictions Weight Bearing Restrictions: Yes RLE Weight Bearing: Weight bearing as tolerated    Mobility  Bed Mobility               General bed mobility comments: OOB in chair  Transfers Overall transfer level: Modified independent Equipment used: Rolling walker (2 wheeled) Transfers: Sit to/from Stand Sit to Stand: Modified independent (Device/Increase time)            Ambulation/Gait Ambulation/Gait assistance: Supervision Gait Distance (Feet): 240 Feet Assistive device: Rolling walker (2 wheeled) Gait Pattern/deviations: Decreased step length - left;Antalgic;Decreased weight shift to right;Step-through pattern Gait velocity: decr   General Gait Details: Patient with consistent step through pattern this session and increased gait speed. Cues provided for hip extension and decreased right step length.   Stairs             Wheelchair Mobility    Modified Rankin (Stroke Patients Only)       Balance Overall balance assessment: Mild deficits observed, not formally tested                                          Cognition Arousal/Alertness:  Awake/alert Behavior During Therapy: WFL for tasks assessed/performed Overall Cognitive Status: Within Functional Limits for tasks assessed                                        Exercises Other Exercises Other Exercises: Verbally reviewed HEP    General Comments        Pertinent Vitals/Pain Pain Assessment: Faces Faces Pain Scale: Hurts little more Pain Location: right hip Pain Descriptors / Indicators: Aching;Grimacing;Guarding Pain Intervention(s): Monitored during session    Home Living                      Prior Function            PT Goals (current goals can now be found in the care plan section) Acute Rehab PT Goals Patient Stated Goal: "get out of bed" PT Goal Formulation: With patient Time For Goal Achievement: 02/09/18 Potential to Achieve Goals: Good Progress towards PT goals: Progressing toward goals    Frequency    7X/week      PT Plan Current plan remains appropriate    Co-evaluation              AM-PAC PT "6 Clicks" Daily Activity  Outcome Measure  Difficulty turning over in bed (including adjusting bedclothes, sheets and blankets)?: None Difficulty moving from lying on back to sitting on  the side of the bed? : A Little Difficulty sitting down on and standing up from a chair with arms (e.g., wheelchair, bedside commode, etc,.)?: A Little Help needed moving to and from a bed to chair (including a wheelchair)?: A Little Help needed walking in hospital room?: A Little Help needed climbing 3-5 steps with a railing? : A Little 6 Click Score: 19    End of Session   Activity Tolerance: Patient tolerated treatment well Patient left: with call bell/phone within reach;in chair Nurse Communication: Mobility status;Other (comment)(adequate for d/c) PT Visit Diagnosis: Muscle weakness (generalized) (M62.81);Difficulty in walking, not elsewhere classified (R26.2);Pain Pain - Right/Left: Right Pain - part of body: Hip      Time: 3557-3220 PT Time Calculation (min) (ACUTE ONLY): 30 min  Charges:  $Gait Training: 23-37 mins                     [name], PT, DPT Acute Rehabilitation Services  Pager: [phone]    [name] 02/06/2018, 9:24 AM

## 2018-02-06 NOTE — Plan of Care (Signed)
  Problem: Health Behavior/Discharge Planning: Goal: Ability to manage health-related needs will improve Outcome: Progressing   Problem: Clinical Measurements: Goal: Will remain free from infection Outcome: Progressing   Problem: Activity: Goal: Risk for activity intolerance will decrease Outcome: Progressing   Problem: Nutrition: Goal: Adequate nutrition will be maintained Outcome: Progressing   Problem: Pain Managment: Goal: General experience of comfort will improve Outcome: Progressing   

## 2018-02-10 DIAGNOSIS — Z0184 Encounter for antibody response examination: Secondary | ICD-10-CM | POA: Diagnosis not present

## 2018-02-13 DIAGNOSIS — M1611 Unilateral primary osteoarthritis, right hip: Secondary | ICD-10-CM | POA: Diagnosis not present

## 2018-08-24 DIAGNOSIS — J209 Acute bronchitis, unspecified: Secondary | ICD-10-CM | POA: Diagnosis not present

## 2018-12-07 DIAGNOSIS — E782 Mixed hyperlipidemia: Secondary | ICD-10-CM | POA: Diagnosis not present

## 2018-12-07 DIAGNOSIS — Z7984 Long term (current) use of oral hypoglycemic drugs: Secondary | ICD-10-CM | POA: Diagnosis not present

## 2018-12-07 DIAGNOSIS — I10 Essential (primary) hypertension: Secondary | ICD-10-CM | POA: Diagnosis not present

## 2018-12-07 DIAGNOSIS — E1169 Type 2 diabetes mellitus with other specified complication: Secondary | ICD-10-CM | POA: Diagnosis not present

## 2018-12-10 DIAGNOSIS — Z7984 Long term (current) use of oral hypoglycemic drugs: Secondary | ICD-10-CM | POA: Diagnosis not present

## 2018-12-10 DIAGNOSIS — K7581 Nonalcoholic steatohepatitis (NASH): Secondary | ICD-10-CM | POA: Diagnosis not present

## 2018-12-10 DIAGNOSIS — I10 Essential (primary) hypertension: Secondary | ICD-10-CM | POA: Diagnosis not present

## 2018-12-10 DIAGNOSIS — G5600 Carpal tunnel syndrome, unspecified upper limb: Secondary | ICD-10-CM | POA: Diagnosis not present

## 2018-12-10 DIAGNOSIS — Z6841 Body Mass Index (BMI) 40.0 and over, adult: Secondary | ICD-10-CM | POA: Diagnosis not present

## 2018-12-10 DIAGNOSIS — E782 Mixed hyperlipidemia: Secondary | ICD-10-CM | POA: Diagnosis not present

## 2018-12-10 DIAGNOSIS — E1169 Type 2 diabetes mellitus with other specified complication: Secondary | ICD-10-CM | POA: Diagnosis not present

## 2018-12-10 DIAGNOSIS — N529 Male erectile dysfunction, unspecified: Secondary | ICD-10-CM | POA: Diagnosis not present

## 2018-12-10 DIAGNOSIS — R748 Abnormal levels of other serum enzymes: Secondary | ICD-10-CM | POA: Diagnosis not present

## 2018-12-10 DIAGNOSIS — Z7189 Other specified counseling: Secondary | ICD-10-CM | POA: Diagnosis not present

## 2019-01-04 DIAGNOSIS — M170 Bilateral primary osteoarthritis of knee: Secondary | ICD-10-CM | POA: Diagnosis not present

## 2019-02-01 DIAGNOSIS — M170 Bilateral primary osteoarthritis of knee: Secondary | ICD-10-CM | POA: Diagnosis not present

## 2019-05-05 ENCOUNTER — Telehealth: Payer: Self-pay | Admitting: Genetic Counselor

## 2019-05-05 NOTE — Telephone Encounter (Signed)
Confirmed 11/17 genetics appointment with patient.

## 2019-05-17 ENCOUNTER — Telehealth: Payer: Self-pay | Admitting: Genetic Counselor

## 2019-05-17 NOTE — Telephone Encounter (Signed)
Called patient regarding upcoming Web[facility] appointment, patient is notified and e-mail has been sent. °

## 2019-05-18 ENCOUNTER — Other Ambulatory Visit: Payer: Managed Care, Other (non HMO)

## 2019-05-18 ENCOUNTER — Inpatient Hospital Stay: Payer: Managed Care, Other (non HMO) | Attending: Genetic Counselor | Admitting: Genetic Counselor

## 2019-05-18 ENCOUNTER — Encounter: Payer: Self-pay | Admitting: Genetic Counselor

## 2019-05-18 DIAGNOSIS — Z8042 Family history of malignant neoplasm of prostate: Secondary | ICD-10-CM

## 2019-05-18 DIAGNOSIS — Z808 Family history of malignant neoplasm of other organs or systems: Secondary | ICD-10-CM

## 2019-05-18 DIAGNOSIS — Z801 Family history of malignant neoplasm of trachea, bronchus and lung: Secondary | ICD-10-CM

## 2019-05-18 DIAGNOSIS — Z803 Family history of malignant neoplasm of breast: Secondary | ICD-10-CM | POA: Insufficient documentation

## 2019-05-18 NOTE — Progress Notes (Signed)
REFERRING PROVIDER: Mayra Neer, MD [facility] [place],  [place] [address]  PRIMARY PROVIDER:  Mayra Neer, MD  PRIMARY REASON FOR VISIT:  1. Family history of breast cancer   2. Family history of prostate cancer   3. Family history of lung cancer   4. Family history of melanoma      I connected with [name] on 05/18/2019 at 11:00 am EDT by [facility] video conference and verified that I am speaking with the correct person using two identifiers.   Patient location: home Provider location: office  HISTORY OF PRESENT ILLNESS:   [name], a 73 y.o. male, was seen for a  cancer genetics consultation at the request of Dr. Brigitte Pulse due to a family history of breast and prostate cancer.  [name] presents to clinic today to discuss the possibility of a hereditary predisposition to cancer, genetic testing, and to further clarify his future cancer risks, as well as potential cancer risks for family members.   [name] is a 73 y.o. male with no personal history of cancer.    CANCER HISTORY:  Oncology History   No history exists.     RISK FACTORS:  Colonoscopy: yes; every 5 years, normal per patient.   Past Medical History:  Diagnosis Date   ED (erectile dysfunction)    takes Viagra as needed   Family history of breast cancer    Family history of lung cancer    Family history of melanoma    Family history of prostate cancer    History of bronchitis last time about 14yr ago   Hyperlipidemia    takes Simvastatin daily   Hypertension    takes Lisinopril daily   Joint pain    Osteoarthritis    knee   Osteoarthritis of right hip    Pre-diabetes    Takes Metformin PRN    Past Surgical History:  Procedure Laterality Date   achiles tendon Left    CHOLECYSTECTOMY     with mesh   COLONOSCOPY     FINGER SURGERY     left pointer    KNEE ARTHROSCOPY Left    REVISION TOTAL HIP ARTHROPLASTY Left 11/13/2014   SHOULDER SURGERY  Right    STEROID INJECTION TO SCAR     about 6 months ago   TONSILLECTOMY     TOTAL HIP ARTHROPLASTY Left 11/08/2014   Procedure: TOTAL HIP ARTHROPLASTY ANTERIOR APPROACH;  Surgeon: PMelrose Nakayama MD;  [facility]  Service: Orthopedics;  Laterality: Left;   TOTAL HIP ARTHROPLASTY Left 11/13/2014   Procedure: REVISION OF LEFT HIP ARTHROPLASTY ANTERIOR APPROACH;  Surgeon: PMelrose Nakayama MD;  [facility]  Service: Orthopedics;  Laterality: Left;   TOTAL HIP ARTHROPLASTY Right 02/03/2018   Procedure: TOTAL HIP ARTHROPLASTY ANTERIOR APPROACH;  Surgeon: DMelrose Nakayama MD;  [facility]  Service: Orthopedics;  Laterality: Right;   VEIN LIGATION AND STRIPPING Left at age 73  wisdom teeth extracted  1972   WRIST SURGERY Left at age 73   Social History   Socioeconomic History   Marital status: Married    Spouse name: Not on file   Number of children: Not on file   Years of education: Not on file   Highest education level: Not on file  Occupational History   Not on file  Social Needs   Financial resource strain: Not hard at all   Food insecurity    Worry: Never true    Inability:  Never true   Transportation needs    Medical: No    Non-medical: No  Tobacco Use   Smoking status: Former Smoker   Smokeless tobacco: Never Used   Tobacco comment: quit smoking in 1993  Substance and Sexual Activity   Alcohol use: Yes    Comment: rarely   Drug use: No   Sexual activity: Yes  Lifestyle   Physical activity    Days per week: Not on file    Minutes per session: Not on file   Stress: Not on file  Relationships   Social connections    Talks on phone: Not on file    Gets together: Not on file    Attends religious service: Not on file    Active member of club or organization: Not on file    Attends meetings of clubs or organizations: Not on file    Relationship status: Not on file  Other Topics Concern   Not on file  Social History Narrative    Not on file     FAMILY HISTORY:  We obtained a detailed, 4-generation family history.  Significant diagnoses are listed below: Family History  Problem Relation Age of Onset   Breast cancer Mother 82   Prostate cancer Father 45   Lung cancer Father    Melanoma Father    Breast cancer Maternal Aunt        diagnosed in her 36s   Breast cancer Maternal Grandmother        diagnosed younger than 66   Cirrhosis Paternal Grandmother        alcoholic   Cancer Maternal Aunt 89       unknown type   Prostate cancer Cousin 61   Cancer Cousin        unknown type, diagnosed 28s   Cancer Cousin 47       unknown type   [name] has four children, three of whom are biologically his. He has one sister and two brothers. None of these individuals or their children have had cancer.  [name] mother died at age 42 and had breast cancer diagnosed at age 64. He has two maternal uncles and five maternal aunts. One of his uncles died at the age of 72 or 59 due to Scarlet fever. One of his aunt's had breast cancer diagnosed in her 90s, and another had an unknown type of cancer diagnosed shortly before she died at age 48. He has three maternal cousins who have had cancer: one cousin had prostate cancer at age 72 and genetic testing (report not available for review at today's visit), one cousin had an unknown cancer diagnosed in her 37s, and the third had an unknown cancer diagnosed at age 86. [name] maternal grandmother had breast cancer diagnosed younger than 67.   [name] father died at age 35 and had prostate cancer diagnosed at age 53, then lung cancer, and melanoma. His father also had frequent sun exposure while golfing and was a smoker. [name] paternal grandparents died at young ages - his grandmother from cirrhosis of the liver due to alcoholism and his grandfather from suicide.  [name] is aware of previous family history of genetic testing for hereditary cancer risks in his  cousin, but is unsure of the results. His maternal ancestors are of [place] descent.   GENETIC COUNSELING ASSESSMENT: [name] is a 73 y.o. male with a family history of breast and prostate cancer, which is somewhat suggestive of a  hereditary cancer syndrome and predisposition to cancer. We, therefore, discussed and recommended the following at today's visit.   DISCUSSION: We discussed that approximately 5-10% of cancer is hereditary, with most cases of hereditary breast cancer associated with the BRCA genes.  There are other genes that can be associated with hereditary breast cancer syndromes.  These include ATM, CHEK2, PALB2, etc.  We discussed that testing is beneficial for several reasons including knowing about other cancer risks, identifying potential screening and risk-reduction options that may be appropriate, and to understand if other family members could be at risk for cancer and allow them to undergo genetic testing.   We reviewed the characteristics, features and inheritance patterns of hereditary cancer syndromes. We also discussed genetic testing, including the appropriate family members to test, the process of testing, insurance coverage and turn-around-time for results. We discussed the implications of a negative, positive and/or variant of uncertain significant result. We recommended [name] pursue genetic testing for the [facility] Common Hereditary Cancers gene panel.   The Common Hereditary Cancers Panel offered by [facility] includes sequencing and/or deletion duplication testing of the following 48 genes: APC, ATM, AXIN2, BARD1, BMPR1A, BRCA1, BRCA2, BRIP1, CDH1, CDK4, CDKN2A (p14ARF), CDKN2A (p16INK4a), CHEK2, CTNNA1, DICER1, EPCAM (Deletion/duplication testing only), GREM1 (promoter region deletion/duplication testing only), KIT, MEN1, MLH1, MSH2, MSH3, MSH6, MUTYH, NBN, NF1, NHTL1, PALB2, PDGFRA, PMS2, POLD1, POLE, PTEN, RAD50, RAD51C, RAD51D, RNF43, SDHB, SDHC, SDHD, SMAD4,  SMARCA4. STK11, TP53, TSC1, TSC2, and VHL.  The following genes were evaluated for sequence changes only: SDHA and HOXB13 c.251G>A variant only.   Based on [name] family history of cancer, he meets medical criteria for genetic testing. Despite that he meets criteria, he may still have an out of pocket cost. We discussed that if his out of pocket cost for testing is over $100, the laboratory will call and confirm whether he wants to proceed with testing.  If the out of pocket cost of testing is less than $100 he will be billed by the genetic testing laboratory.   PLAN: After considering the risks, benefits, and limitations, [name] provided informed consent to pursue genetic testing. A saliva kit will be delivered to his house, which he will then be responsible for returning to [facility] for analysis of the Common Hereditary Cancers panel. Results should be available within approximately two-three weeks' time, at which point they will be disclosed by telephone to [name], as will any additional recommendations warranted by these results. [name] will receive a summary of his genetic counseling visit and a copy of his results once available. This information will also be available in Epic.   [name] questions were answered to his satisfaction today. Our contact information was provided should additional questions or concerns arise. Thank you for the referral and allowing [place] to share in the care of your patient.   Gary Guy, MS, [facility] Certified Genetic Counselor [email or web address] Phone: [phone]  The patient was seen for a total of 60 minutes in face-to-face genetic counseling.  This patient was discussed with Drs. Magrinat, Lindi Adie and/or Burr Medico who agrees with the above.    _______________________________________________________________________ For Office Staff:  Number of people involved in session: 1 Was an Intern/ student involved with case: no

## 2019-05-18 NOTE — Progress Notes (Signed)
Mr. Rinella spoke with his cousin who had prostate cancer and confirmed that his genetic test results were negative. He learned that this cousin's brother also had prostate cancer, diagnosed at age 72. These cousins also have a significant family history of cancer in their father (who is not biologically related to Mr. Gilden) and his family.

## 2019-06-17 ENCOUNTER — Telehealth: Payer: Self-pay | Admitting: Genetic Counselor

## 2019-06-17 NOTE — Telephone Encounter (Signed)
Called to check in regarding the status of [name] genetic testing. He will send his saliva kit in the mail tomorrow (12/18) or Saturday (12/19). We will reach out to him once we have his results.

## 2019-07-05 ENCOUNTER — Telehealth: Payer: Self-pay | Admitting: Genetic Counselor

## 2019-07-05 ENCOUNTER — Encounter: Payer: Self-pay | Admitting: Genetic Counselor

## 2019-07-05 ENCOUNTER — Ambulatory Visit: Payer: Self-pay | Admitting: Genetic Counselor

## 2019-07-05 DIAGNOSIS — Z1379 Encounter for other screening for genetic and chromosomal anomalies: Secondary | ICD-10-CM | POA: Insufficient documentation

## 2019-07-05 NOTE — Telephone Encounter (Signed)
LVM that his genetic test results are available and requested that he call back to discuss them.  

## 2019-07-05 NOTE — Progress Notes (Signed)
HPI:  [name] was previously seen in the [facility] due to a family history of breast, prostate, and pancreatic cancer, and concerns regarding a hereditary predisposition to cancer. Please refer to our prior cancer genetics clinic note for more information regarding our discussion, assessment and recommendations, at the time. [name] recent genetic test results were disclosed to him, as were recommendations warranted by these results. These results and recommendations are discussed in more detail below.  CANCER HISTORY:  Oncology History   No history exists.    FAMILY HISTORY:  We obtained a detailed, 4-generation family history.  Significant diagnoses are listed below: Family History  Problem Relation Age of Onset  . Breast cancer Mother 5  . Prostate cancer Father 9  . Lung cancer Father   . Melanoma Father   . Breast cancer Maternal Aunt        diagnosed in her 58s  . Breast cancer Maternal Grandmother        diagnosed younger than 33  . Cirrhosis Paternal Grandmother        alcoholic  . Cancer Maternal Aunt 89       unknown type  . Prostate cancer Cousin 92  . Cancer Cousin        unknown type, diagnosed 35s  . Cancer Cousin 61       unknown type     [name] has four children, three of whom are biologically his. He has one sister and two brothers. None of these individuals or their children have had cancer.  [name] mother died at age 41 and had breast cancer diagnosed at age 97. He has two maternal uncles and five maternal aunts. One of his uncles died at the age of 11 or 60 due to Scarlet fever. One of his aunt's had breast cancer diagnosed in her 79s, and another had an unknown type of cancer diagnosed shortly before she died at age 61. He has three maternal cousins who have had cancer: one cousin had prostate cancer at age 43 and genetic testing (report not available for review), one cousin had an unknown cancer diagnosed in her 29s, and  the third had an unknown cancer diagnosed at age 81. [name] maternal grandmother had breast cancer diagnosed younger than 81.   [name] father died at age 38 and had prostate cancer diagnosed at age 56, then lung cancer, and melanoma. His father also had frequent sun exposure while golfing and was a smoker. [name] paternal grandparents died at young ages - his grandmother from cirrhosis of the liver due to alcoholism and his grandfather from suicide.  [name] is aware of previous family history of genetic testing for hereditary cancer risks in his cousin, but is unsure of the results. His maternal ancestors are of [place] descent.   GENETIC TEST RESULTS: Genetic testing reported out on 07/04/2019 through the [facility] Common Hereditary Cancers panel. No pathogenic variants were detected.   The Common Hereditary Cancers Panel offered by [facility] includes sequencing and/or deletion duplication testing of the following 48 genes: APC, ATM, AXIN2, BARD1, BMPR1A, BRCA1, BRCA2, BRIP1, CDH1, CDK4, CDKN2A (p14ARF), CDKN2A (p16INK4a), CHEK2, CTNNA1, DICER1, EPCAM (Deletion/duplication testing only), GREM1 (promoter region deletion/duplication testing only), KIT, MEN1, MLH1, MSH2, MSH3, MSH6, MUTYH, NBN, NF1, NHTL1, PALB2, PDGFRA, PMS2, POLD1, POLE, PTEN, RAD50, RAD51C, RAD51D, RNF43, SDHB, SDHC, SDHD, SMAD4, SMARCA4. STK11, TP53, TSC1, TSC2, and VHL.  The following genes were evaluated for sequence changes only: SDHA and  HOXB13 c.251G>A variant only. The test report will be scanned into EPIC and located under the Molecular Pathology section of the Results Review tab.  A portion of the result report is included below for reference.     We discussed with [name] that because current genetic testing is not perfect, it is possible there may be a gene mutation in one of these genes that current testing cannot detect, but that chance is small.  We also discussed, that there could be another gene  that has not yet been discovered, or that we have not yet tested, that is responsible for the cancer diagnoses in the family. It is also possible there is a hereditary cause for the cancer in the family that [name] did not inherit and therefore was not identified in his testing.  Therefore, it is important to remain in touch with cancer genetics in the future so that we can continue to offer [name] the most up to date genetic testing.   CANCER SCREENING RECOMMENDATIONS: [name] test result is considered negative (normal).  This means that we have not identified a hereditary cause for his family history of cancer at this time.  While reassuring, this does not definitively rule out a hereditary predisposition to cancer. It is still possible that there could be genetic mutations that are undetectable by current technology. There could be genetic mutations in genes that have not been tested or identified to increase cancer risk.  Therefore, it is recommended he continue to follow the cancer management and screening guidelines provided by his primary healthcare provider.   An individual's cancer risk and medical management are not determined by genetic test results alone. Overall cancer risk assessment incorporates additional factors, including personal medical history, family history, and any available genetic information that may result in a personalized plan for cancer prevention and surveillance.  RECOMMENDATIONS FOR FAMILY MEMBERS:  Individuals in this family might be at some increased risk of developing cancer, over the general population risk, simply due to the family history of cancer.  We recommended women in this family have a yearly mammogram beginning at age 44, or 68 years younger than the earliest onset of cancer, an annual clinical breast exam, and perform monthly breast self-exams. Women in this family should also have a gynecological exam as recommended by their primary provider. All  family members should have a colonoscopy by age 54.  FOLLOW-UP: Lastly, we discussed with [name] that cancer genetics is a rapidly advancing field and it is possible that new genetic tests will be appropriate for him and/or his family members in the future. We encouraged him to remain in contact with cancer genetics on an annual basis so we can update his personal and family histories and let him know of advances in cancer genetics that may benefit this family.   Our contact number was provided. [name] questions were answered to his satisfaction, and he knows he is welcome to call us at anytime with additional questions or concerns.   Clint Guy, MS, [facility] Certified Genetic Counselor [email or web address] Phone: [phone]

## 2019-07-05 NOTE — Telephone Encounter (Addendum)
Revealed negative genetic testing.  Discussed that we do not know why there is cancer in the family.  It is possible that there is a hereditary cause for the cancer in the family that he did not inherit.  It could also be due to a different gene that we are not testing, or our current technology may not be able detect something.  It will be a good idea for him to keep in contact with genetics to keep up with whether additional testing may be appropriate in the future.

## 2019-07-23 DIAGNOSIS — Z23 Encounter for immunization: Secondary | ICD-10-CM | POA: Diagnosis not present

## 2019-08-13 DIAGNOSIS — Z23 Encounter for immunization: Secondary | ICD-10-CM | POA: Diagnosis not present

## 2020-07-10 DIAGNOSIS — Z125 Encounter for screening for malignant neoplasm of prostate: Secondary | ICD-10-CM | POA: Diagnosis not present

## 2020-07-10 DIAGNOSIS — N5201 Erectile dysfunction due to arterial insufficiency: Secondary | ICD-10-CM | POA: Diagnosis not present

## 2020-07-10 DIAGNOSIS — E782 Mixed hyperlipidemia: Secondary | ICD-10-CM | POA: Diagnosis not present

## 2020-07-10 DIAGNOSIS — Z0000 Encounter for general adult medical examination without abnormal findings: Secondary | ICD-10-CM | POA: Diagnosis not present

## 2020-07-10 DIAGNOSIS — I10 Essential (primary) hypertension: Secondary | ICD-10-CM | POA: Diagnosis not present

## 2020-07-10 DIAGNOSIS — Z23 Encounter for immunization: Secondary | ICD-10-CM | POA: Diagnosis not present

## 2020-07-10 DIAGNOSIS — E1169 Type 2 diabetes mellitus with other specified complication: Secondary | ICD-10-CM | POA: Diagnosis not present

## 2020-08-23 DIAGNOSIS — M1711 Unilateral primary osteoarthritis, right knee: Secondary | ICD-10-CM | POA: Diagnosis not present

## 2020-08-23 DIAGNOSIS — M170 Bilateral primary osteoarthritis of knee: Secondary | ICD-10-CM | POA: Diagnosis not present

## 2020-08-23 DIAGNOSIS — M1712 Unilateral primary osteoarthritis, left knee: Secondary | ICD-10-CM | POA: Diagnosis not present

## 2020-09-09 DIAGNOSIS — S99922A Unspecified injury of left foot, initial encounter: Secondary | ICD-10-CM | POA: Diagnosis not present

## 2020-09-09 DIAGNOSIS — S91212A Laceration without foreign body of left great toe with damage to nail, initial encounter: Secondary | ICD-10-CM | POA: Diagnosis not present

## 2020-09-09 DIAGNOSIS — W19XXXA Unspecified fall, initial encounter: Secondary | ICD-10-CM | POA: Diagnosis not present

## 2020-09-09 DIAGNOSIS — S92402A Displaced unspecified fracture of left great toe, initial encounter for closed fracture: Secondary | ICD-10-CM | POA: Diagnosis not present

## 2020-09-11 DIAGNOSIS — S92422B Displaced fracture of distal phalanx of left great toe, initial encounter for open fracture: Secondary | ICD-10-CM | POA: Diagnosis not present

## 2020-09-29 DIAGNOSIS — S92422B Displaced fracture of distal phalanx of left great toe, initial encounter for open fracture: Secondary | ICD-10-CM | POA: Diagnosis not present

## 2020-10-06 DIAGNOSIS — E1169 Type 2 diabetes mellitus with other specified complication: Secondary | ICD-10-CM | POA: Diagnosis not present

## 2020-10-06 DIAGNOSIS — E782 Mixed hyperlipidemia: Secondary | ICD-10-CM | POA: Diagnosis not present

## 2020-10-20 DIAGNOSIS — M1712 Unilateral primary osteoarthritis, left knee: Secondary | ICD-10-CM | POA: Diagnosis not present

## 2020-10-27 DIAGNOSIS — M1712 Unilateral primary osteoarthritis, left knee: Secondary | ICD-10-CM | POA: Diagnosis not present

## 2020-11-03 DIAGNOSIS — M1711 Unilateral primary osteoarthritis, right knee: Secondary | ICD-10-CM | POA: Diagnosis not present

## 2021-01-02 DIAGNOSIS — U071 COVID-19: Secondary | ICD-10-CM | POA: Diagnosis not present

## 2021-01-02 DIAGNOSIS — R059 Cough, unspecified: Secondary | ICD-10-CM | POA: Diagnosis not present

## 2021-07-19 DIAGNOSIS — E1169 Type 2 diabetes mellitus with other specified complication: Secondary | ICD-10-CM | POA: Diagnosis not present

## 2021-07-19 DIAGNOSIS — I10 Essential (primary) hypertension: Secondary | ICD-10-CM | POA: Diagnosis not present

## 2021-07-19 DIAGNOSIS — Z125 Encounter for screening for malignant neoplasm of prostate: Secondary | ICD-10-CM | POA: Diagnosis not present

## 2021-07-19 DIAGNOSIS — Z1211 Encounter for screening for malignant neoplasm of colon: Secondary | ICD-10-CM | POA: Diagnosis not present

## 2021-07-19 DIAGNOSIS — N5201 Erectile dysfunction due to arterial insufficiency: Secondary | ICD-10-CM | POA: Diagnosis not present

## 2021-07-19 DIAGNOSIS — Z23 Encounter for immunization: Secondary | ICD-10-CM | POA: Diagnosis not present

## 2021-07-19 DIAGNOSIS — E782 Mixed hyperlipidemia: Secondary | ICD-10-CM | POA: Diagnosis not present

## 2021-07-19 DIAGNOSIS — Z0000 Encounter for general adult medical examination without abnormal findings: Secondary | ICD-10-CM | POA: Diagnosis not present

## 2021-07-27 DIAGNOSIS — E1165 Type 2 diabetes mellitus with hyperglycemia: Secondary | ICD-10-CM | POA: Diagnosis not present

## 2021-07-27 DIAGNOSIS — I10 Essential (primary) hypertension: Secondary | ICD-10-CM | POA: Diagnosis not present

## 2021-07-27 DIAGNOSIS — E7800 Pure hypercholesterolemia, unspecified: Secondary | ICD-10-CM | POA: Diagnosis not present

## 2021-08-01 DIAGNOSIS — E7800 Pure hypercholesterolemia, unspecified: Secondary | ICD-10-CM | POA: Diagnosis not present

## 2021-08-01 DIAGNOSIS — E1165 Type 2 diabetes mellitus with hyperglycemia: Secondary | ICD-10-CM | POA: Diagnosis not present

## 2021-08-01 DIAGNOSIS — I10 Essential (primary) hypertension: Secondary | ICD-10-CM | POA: Diagnosis not present

## 2021-08-10 DIAGNOSIS — E1165 Type 2 diabetes mellitus with hyperglycemia: Secondary | ICD-10-CM | POA: Diagnosis not present

## 2021-08-10 DIAGNOSIS — E7800 Pure hypercholesterolemia, unspecified: Secondary | ICD-10-CM | POA: Diagnosis not present

## 2021-08-10 DIAGNOSIS — I10 Essential (primary) hypertension: Secondary | ICD-10-CM | POA: Diagnosis not present

## 2021-09-25 DIAGNOSIS — R059 Cough, unspecified: Secondary | ICD-10-CM | POA: Diagnosis not present

## 2021-09-25 DIAGNOSIS — J069 Acute upper respiratory infection, unspecified: Secondary | ICD-10-CM | POA: Diagnosis not present

## 2021-10-23 DIAGNOSIS — M25562 Pain in left knee: Secondary | ICD-10-CM | POA: Diagnosis not present

## 2021-10-23 DIAGNOSIS — M1712 Unilateral primary osteoarthritis, left knee: Secondary | ICD-10-CM | POA: Diagnosis not present

## 2021-10-23 DIAGNOSIS — M25561 Pain in right knee: Secondary | ICD-10-CM | POA: Diagnosis not present

## 2021-10-23 DIAGNOSIS — M1711 Unilateral primary osteoarthritis, right knee: Secondary | ICD-10-CM | POA: Diagnosis not present

## 2021-12-07 DIAGNOSIS — M1712 Unilateral primary osteoarthritis, left knee: Secondary | ICD-10-CM | POA: Diagnosis not present

## 2021-12-14 DIAGNOSIS — M1712 Unilateral primary osteoarthritis, left knee: Secondary | ICD-10-CM | POA: Diagnosis not present

## 2021-12-22 DIAGNOSIS — M1712 Unilateral primary osteoarthritis, left knee: Secondary | ICD-10-CM | POA: Diagnosis not present

## 2022-03-13 DIAGNOSIS — E782 Mixed hyperlipidemia: Secondary | ICD-10-CM | POA: Diagnosis not present

## 2022-03-13 DIAGNOSIS — I10 Essential (primary) hypertension: Secondary | ICD-10-CM | POA: Diagnosis not present

## 2022-03-13 DIAGNOSIS — Z23 Encounter for immunization: Secondary | ICD-10-CM | POA: Diagnosis not present

## 2022-03-13 DIAGNOSIS — E1169 Type 2 diabetes mellitus with other specified complication: Secondary | ICD-10-CM | POA: Diagnosis not present

## 2022-04-19 ENCOUNTER — Other Ambulatory Visit: Payer: Self-pay | Admitting: Orthopaedic Surgery

## 2022-04-22 DIAGNOSIS — M25661 Stiffness of right knee, not elsewhere classified: Secondary | ICD-10-CM | POA: Diagnosis not present

## 2022-04-22 DIAGNOSIS — M1731 Unilateral post-traumatic osteoarthritis, right knee: Secondary | ICD-10-CM | POA: Diagnosis not present

## 2022-04-22 DIAGNOSIS — R262 Difficulty in walking, not elsewhere classified: Secondary | ICD-10-CM | POA: Diagnosis not present

## 2022-05-03 NOTE — Patient Instructions (Signed)
DUE TO COVID-19 ONLY TWO VISITORS  (aged 75 and older)  ARE ALLOWED TO COME WITH YOU AND STAY IN THE WAITING ROOM ONLY DURING PRE OP AND PROCEDURE.   **NO VISITORS ARE ALLOWED IN THE SHORT STAY AREA OR RECOVERY ROOM!!**  IF YOU WILL BE ADMITTED INTO THE HOSPITAL YOU ARE ALLOWED ONLY FOUR SUPPORT PEOPLE DURING VISITATION HOURS ONLY (7 AM -8PM)   The support person(s) must pass our screening, gel in and out, and wear a mask at all times, including in the patient's room. Patients must also wear a mask when staff or their support person are in the room. Visitors GUEST BADGE MUST BE WORN VISIBLY  One adult visitor may remain with you overnight and MUST be in the room by 8 P.M.     Your procedure is scheduled on: 05/14/22   Report to [facility] Main Entrance    Report to admitting at : 5:15 AM   Call this number if you have problems the morning of surgery [phone]   Do not eat food :After Midnight.   After Midnight you may have the following liquids until : 4:30 AM DAY OF SURGERY  Water Black Coffee (sugar ok, NO MILK/CREAM OR CREAMERS)  Tea (sugar ok, NO MILK/CREAM OR CREAMERS) regular and decaf                             Plain Jell-O (NO RED)                                           Fruit ices (not with fruit pulp, NO RED)                                     Popsicles (NO RED)                                                                  Juice: apple, WHITE grape, WHITE cranberry Sports drinks like Gatorade (NO RED)              Drink G2 drink AT : 4:30 AM the day of surgery.      The day of surgery:  Drink ONE (1) Pre-Surgery Clear Ensure or G2 at AM the morning of surgery. Drink in one sitting. Do not sip.  This drink was given to you during your hospital  pre-op appointment visit. Nothing else to drink after completing the  Pre-Surgery Clear Ensure or G2.          If you have questions, please contact your surgeon's office.   Oral Hygiene is also important  to reduce your risk of infection.                                    Remember - BRUSH YOUR TEETH THE MORNING OF SURGERY WITH YOUR REGULAR TOOTHPASTE   Do NOT smoke after Midnight   Take these medicines the morning of surgery with A  SIP OF WATER:   How to Manage Your Diabetes Before and After Surgery  Why is it important to control my blood sugar before and after surgery? Improving blood sugar levels before and after surgery helps healing and can limit problems. A way of improving blood sugar control is eating a healthy diet by:  Eating less sugar and carbohydrates  Increasing activity/exercise  Talking with your doctor about reaching your blood sugar goals High blood sugars (greater than 180 mg/dL) can raise your risk of infections and slow your recovery, so you will need to focus on controlling your diabetes during the weeks before surgery. Make sure that the doctor who takes care of your diabetes knows about your planned surgery including the date and location.  How do I manage my blood sugar before surgery? Check your blood sugar at least 4 times a day, starting 2 days before surgery, to make sure that the level is not too high or low. Check your blood sugar the morning of your surgery when you wake up and every 2 hours until you get to the Short Stay unit. If your blood sugar is less than 70 mg/dL, you will need to treat for low blood sugar: Do not take insulin. Treat a low blood sugar (less than 70 mg/dL) with  cup of clear juice (cranberry or apple), 4 glucose tablets, OR glucose gel. Recheck blood sugar in 15 minutes after treatment (to make sure it is greater than 70 mg/dL). If your blood sugar is not greater than 70 mg/dL on recheck, call [phone] for further instructions. Report your blood sugar to the short stay nurse when you get to Short Stay.  If you are admitted to the hospital after surgery: Your blood sugar will be checked by the staff and you will probably be given  insulin after surgery (instead of oral diabetes medicines) to make sure you have good blood sugar levels. The goal for blood sugar control after surgery is 80-180 mg/dL.   WHAT DO I DO ABOUT MY DIABETES MEDICATION?  HOLD jardiance: 72 hrs ( 3 days) before surgery.  THE NIGHT BEFORE SURGERY, take metformin as usual.Take ONLY half of the toujeo solastar insulin dose.     THE MORNING OF SURGERY, DO NOT TAKE ANY ORAL DIABETIC MEDICATIONS DAY OF YOUR SURGERY                              You may not have any metal on your body including hair pins, jewelry, and body piercing             Do not wear lotions, powders, perfumes/cologne, or deodorant              Men may shave face and neck.   Do not bring valuables to the hospital. [place].   Contacts, dentures or bridgework may not be worn into surgery.   Bring small overnight bag day of surgery.   DO NOT [place]. PHARMACY WILL DISPENSE MEDICATIONS LISTED ON YOUR MEDICATION LIST TO YOU DURING YOUR ADMISSION [name]!    Patients discharged on the day of surgery will not be allowed to drive home.  Someone NEEDS to stay with you for the first 24 hours after anesthesia.   Special Instructions: Bring a copy of your healthcare power  of attorney and living will documents         the day of surgery if you haven't scanned them before.              Please read over the following fact sheets you were given: IF YOU HAVE QUESTIONS ABOUT YOUR PRE-OP INSTRUCTIONS PLEASE CALL [phone]    [facility] - Preparing for Surgery Before surgery, you can play an important role.  Because skin is not sterile, your skin needs to be as free of germs as possible.  You can reduce the number of germs on your skin by washing with CHG (chlorahexidine gluconate) soap before surgery.  CHG is an antiseptic cleaner which kills germs and bonds with the skin to continue killing  germs even after washing. Please DO NOT use if you have an allergy to CHG or antibacterial soaps.  If your skin becomes reddened/irritated stop using the CHG and inform your nurse when you arrive at Short Stay. Do not shave (including legs and underarms) for at least 48 hours prior to the first CHG shower.  You may shave your face/neck. Please follow these instructions carefully:  1.  Shower with CHG Soap the night before surgery and the  morning of Surgery.  2.  If you choose to wash your hair, wash your hair first as usual with your  normal  shampoo.  3.  After you shampoo, rinse your hair and body thoroughly to remove the  shampoo.                           4.  Use CHG as you would any other liquid soap.  You can apply chg directly  to the skin and wash                       Gently with a scrungie or clean washcloth.  5.  Apply the CHG Soap to your body ONLY FROM THE NECK DOWN.   Do not use on face/ open                           Wound or open sores. Avoid contact with eyes, ears mouth and genitals (private parts).                       Wash face,  Genitals (private parts) with your normal soap.             6.  Wash thoroughly, paying special attention to the area where your surgery  will be performed.  7.  Thoroughly rinse your body with warm water from the neck down.  8.  DO NOT shower/wash with your normal soap after using and rinsing off  the CHG Soap.                9.  Pat yourself dry with a clean towel.            10.  Wear clean pajamas.            11.  Place clean sheets on your bed the night of your first shower and do not  sleep with pets. Day of Surgery : Do not apply any lotions/deodorants the morning of surgery.  Please wear clean clothes to the hospital/surgery center.  FAILURE TO FOLLOW THESE INSTRUCTIONS MAY RESULT IN THE CANCELLATION OF YOUR SURGERY PATIENT SIGNATURE_________________________________  NURSE  SIGNATURE__________________________________  ________________________________________________________________________  [name]  An incentive spirometer is a tool that can help keep your lungs clear and active. This tool measures how well you are filling your lungs with each breath. Taking long deep breaths may help reverse or decrease the chance of developing breathing (pulmonary) problems (especially infection) following: A long period of time when you are unable to move or be active. BEFORE THE PROCEDURE  If the spirometer includes an indicator to show your best effort, your nurse or respiratory therapist will set it to a desired goal. If possible, sit up straight or lean slightly forward. Try not to slouch. Hold the incentive spirometer in an upright position. INSTRUCTIONS FOR USE  Sit on the edge of your bed if possible, or sit up as far as you can in bed or on a chair. Hold the incentive spirometer in an upright position. Breathe out normally. Place the mouthpiece in your mouth and seal your lips tightly around it. Breathe in slowly and as deeply as possible, raising the piston or the ball toward the top of the column. Hold your breath for 3-5 seconds or for as long as possible. Allow the piston or ball to fall to the bottom of the column. Remove the mouthpiece from your mouth and breathe out normally. Rest for a few seconds and repeat Steps 1 through 7 at least 10 times every 1-2 hours when you are awake. Take your time and take a few normal breaths between deep breaths. The spirometer may include an indicator to show your best effort. Use the indicator as a goal to work toward during each repetition. After each set of 10 deep breaths, practice coughing to be sure your lungs are clear. If you have an incision (the cut made at the time of surgery), support your incision when coughing by placing a pillow or rolled up towels firmly against it. Once you are able to get out of  bed, walk around indoors and cough well. You may stop using the incentive spirometer when instructed by your caregiver.  RISKS AND COMPLICATIONS Take your time so you do not get dizzy or light-headed. If you are in pain, you may need to take or ask for pain medication before doing incentive spirometry. It is harder to take a deep breath if you are having pain. AFTER USE Rest and breathe slowly and easily. It can be helpful to keep track of a log of your progress. Your caregiver can provide you with a simple table to help with this. If you are using the spirometer at home, follow these instructions: [place] IF:  You are having difficultly using the spirometer. You have trouble using the spirometer as often as instructed. Your pain medication is not giving enough relief while using the spirometer. You develop fever of 100.5 F (38.1 C) or higher. SEEK IMMEDIATE MEDICAL CARE IF:  You cough up bloody sputum that had not been present before. You develop fever of 102 F (38.9 C) or greater. You develop worsening pain at or near the incision site. MAKE SURE YOU:  Understand these instructions. Will watch your condition. Will get help right away if you are not doing well or get worse. Document Released: 10/28/2006 Document Revised: 09/09/2011 Document Reviewed: 12/29/2006 [facility] Patient Information 2014 [place], [place].   ________________________________________________________________________

## 2022-05-06 ENCOUNTER — Ambulatory Visit (HOSPITAL_COMMUNITY)
Admission: RE | Admit: 2022-05-06 | Discharge: 2022-05-06 | Disposition: A | Discharge status: Home / Self Care | Payer: Managed Care, Other (non HMO) | Source: Ambulatory Visit | Attending: Orthopaedic Surgery | Admitting: Orthopaedic Surgery

## 2022-05-06 ENCOUNTER — Other Ambulatory Visit: Payer: Self-pay

## 2022-05-06 ENCOUNTER — Encounter (HOSPITAL_COMMUNITY): Payer: Self-pay

## 2022-05-06 ENCOUNTER — Encounter (HOSPITAL_COMMUNITY)
Admission: RE | Admit: 2022-05-06 | Discharge: 2022-05-06 | Disposition: A | Discharge status: Home / Self Care | Payer: Managed Care, Other (non HMO) | Source: Ambulatory Visit | Attending: Orthopaedic Surgery | Admitting: Orthopaedic Surgery

## 2022-05-06 VITALS — BP 142/87 | HR 70 | Temp 98.3°F | Ht 64.8 in | Wt 271.0 lb

## 2022-05-06 DIAGNOSIS — I2510 Atherosclerotic heart disease of native coronary artery without angina pectoris: Secondary | ICD-10-CM | POA: Insufficient documentation

## 2022-05-06 DIAGNOSIS — Z1379 Encounter for other screening for genetic and chromosomal anomalies: Secondary | ICD-10-CM

## 2022-05-06 DIAGNOSIS — Z01818 Encounter for other preprocedural examination: Secondary | ICD-10-CM | POA: Diagnosis not present

## 2022-05-06 DIAGNOSIS — Z801 Family history of malignant neoplasm of trachea, bronchus and lung: Secondary | ICD-10-CM

## 2022-05-06 DIAGNOSIS — Z96649 Presence of unspecified artificial hip joint: Secondary | ICD-10-CM

## 2022-05-06 DIAGNOSIS — E119 Type 2 diabetes mellitus without complications: Secondary | ICD-10-CM | POA: Diagnosis not present

## 2022-05-06 DIAGNOSIS — Z8042 Family history of malignant neoplasm of prostate: Secondary | ICD-10-CM

## 2022-05-06 DIAGNOSIS — Z803 Family history of malignant neoplasm of breast: Secondary | ICD-10-CM

## 2022-05-06 DIAGNOSIS — Z808 Family history of malignant neoplasm of other organs or systems: Secondary | ICD-10-CM

## 2022-05-06 DIAGNOSIS — D62 Acute posthemorrhagic anemia: Secondary | ICD-10-CM

## 2022-05-06 DIAGNOSIS — M1612 Unilateral primary osteoarthritis, left hip: Secondary | ICD-10-CM

## 2022-05-06 DIAGNOSIS — M1611 Unilateral primary osteoarthritis, right hip: Secondary | ICD-10-CM

## 2022-05-06 LAB — SURGICAL PCR SCREEN
MRSA, PCR: NEGATIVE
Staphylococcus aureus: NEGATIVE

## 2022-05-06 LAB — BASIC METABOLIC PANEL
Anion gap: 11 (ref 5–15)
BUN: 20 mg/dL (ref 8–23)
CO2: 23 mmol/L (ref 22–32)
Calcium: 9.4 mg/dL (ref 8.9–10.3)
Chloride: 104 mmol/L (ref 98–111)
Creatinine, Ser: 0.94 mg/dL (ref 0.61–1.24)
GFR, Estimated: >60 mL/min (ref >60)
Glucose, Bld: 141 mg/dL — ABNORMAL HIGH (ref 70–99)
Potassium: 3.9 mmol/L (ref 3.5–5.1)
Sodium: 138 mmol/L (ref 135–145)

## 2022-05-06 LAB — CBC
HCT: 48.8 % (ref 39.0–52.0)
Hemoglobin: 16.5 g/dL (ref 13.0–17.0)
MCH: 30.4 pg (ref 26.0–34.0)
MCHC: 33.8 g/dL (ref 30.0–36.0)
MCV: 90 fL (ref 80.0–100.0)
Platelets: 171 10*3/uL (ref 150–400)
RBC: 5.42 MIL/uL (ref 4.22–5.81)
RDW: 13.4 % (ref 11.5–15.5)
WBC: 6.8 10*3/uL (ref 4.0–10.5)
nRBC: 0 % (ref 0.0–0.2)

## 2022-05-06 LAB — HEMOGLOBIN A1C
Hgb A1c MFr Bld: 6.3 % — ABNORMAL HIGH (ref 4.8–5.6)
Mean Plasma Glucose: 134.11 mg/dL

## 2022-05-06 LAB — GLUCOSE, CAPILLARY: Glucose-Capillary: 162 mg/dL — ABNORMAL HIGH (ref 70–99)

## 2022-05-06 NOTE — Progress Notes (Signed)
   05/06/22 0816  OBSTRUCTIVE SLEEP APNEA  Have you ever been diagnosed with sleep apnea through a sleep study? No  Do you snore loudly (loud enough to be heard through closed doors)?  1  Do you often feel tired, fatigued, or sleepy during the daytime (such as falling asleep during driving or talking to someone)? 1  Has anyone observed you stop breathing during your sleep? 0  Do you have, or are you being treated for high blood pressure? 1  BMI more than 35 kg/m2? 1  Age > 50 (1-yes) 1  Neck circumference greater than:Male 16 inches or larger, Male 17inches or larger? 0  Male Gender (Yes=1) 1  Obstructive Sleep Apnea Score 6  Score 5 or greater  Results sent to PCP

## 2022-05-06 NOTE — Progress Notes (Signed)
For Short Stay: [place] appointment date:  Bowel Prep reminder:   For Anesthesia: PCP - Dr. Mayra Neer Cardiologist -   Chest x-ray -  EKG -  Stress Test -  ECHO -  Cardiac Cath -  Pacemaker/ICD device last checked: Pacemaker orders received: Device Rep notified:  Spinal Cord Stimulator:  Sleep Study -  CPAP -   Fasting Blood Sugar - 120-170 Checks Blood Sugar ___1__ times a day Date and result of last Hgb A1c-  Last dose of GLP1 agonist-  GLP1 instructions:   Last dose of SGLT-2 inhibitors-  SGLT-2 instructions:   Blood Thinner Instructions: Aspirin Instructions: Has been on hold for a month. Last Dose:  Activity level: Can go up a flight of stairs and activities of daily living without stopping and without chest pain and/or shortness of breath   Able to exercise without chest pain and/or shortness of breath   Unable to go up a flight of stairs without chest pain and/or shortness of breath     Anesthesia review: Hx: HTN,DIA  Patient denies shortness of breath, fever, cough and chest pain at PAT appointment   Patient verbalized understanding of instructions that were given to them at the PAT appointment. Patient was also instructed that they will need to review over the PAT instructions again at home before surgery.

## 2022-05-13 ENCOUNTER — Encounter (HOSPITAL_COMMUNITY): Payer: Self-pay | Admitting: Orthopaedic Surgery

## 2022-05-13 MED ORDER — TRANEXAMIC ACID 1000 MG/10ML IV SOLN
2000.0000 mg | INTRAVENOUS | Status: DC
  Filled 2022-05-13: qty 20

## 2022-05-13 NOTE — H&P (Cosign Needed Addendum)
TOTAL KNEE ADMISSION H&P  Patient is being admitted for right total knee arthroplasty.  Subjective:  Chief Complaint:right knee pain.  HPI: PER [place], 76 y.o. male, has a history of pain and functional disability in the right knee due to arthritis and has failed non-surgical conservative treatments for greater than 12 weeks to includeNSAID's and/or analgesics, corticosteriod injections, viscosupplementation injections, flexibility and strengthening excercises, supervised PT with diminished ADL's post treatment, use of assistive devices, weight reduction as appropriate, and activity modification.  Onset of symptoms was gradual, starting 5 years ago with gradually worsening course since that time. The patient noted no past surgery on the right knee(s).  Patient currently rates pain in the right knee(s) at 10 out of 10 with activity. Patient has night pain, worsening of pain with activity and weight bearing, pain that interferes with activities of daily living, crepitus, and joint swelling.  Patient has evidence of subchondral cysts, subchondral sclerosis, periarticular osteophytes, and joint space narrowing by imaging studies. There is no active infection.  Patient Active Problem List   Diagnosis Date Noted   Genetic testing 07/05/2019   Family history of breast cancer    Family history of prostate cancer    Family history of lung cancer    Family history of melanoma    Primary osteoarthritis of right hip 02/03/2018   Postoperative anemia due to acute blood loss 11/16/2014   Status post revision of total hip 11/13/2014   Morbid obesity ([place]) 11/13/2014   Primary osteoarthritis of left hip 11/08/2014   Past Medical History:  Diagnosis Date   Diabetes mellitus without complication ([place])    ED (erectile dysfunction)    takes Viagra as needed   Family history of breast cancer    Family history of lung cancer    Family history of melanoma    Family history of prostate cancer    History  of bronchitis last time about 61yr ago   Hyperlipidemia    takes Simvastatin daily   Hypertension    takes Lisinopril daily   Joint pain    Osteoarthritis    knee   Osteoarthritis of right hip     Past Surgical History:  Procedure Laterality Date   achiles tendon Left    CHOLECYSTECTOMY     with mesh   COLONOSCOPY     FINGER SURGERY     left pointer    KNEE ARTHROSCOPY Left    REVISION TOTAL HIP ARTHROPLASTY Left 11/13/2014   SHOULDER SURGERY Right    STEROID INJECTION TO SCAR     about 6 months ago   TONSILLECTOMY     TOTAL HIP ARTHROPLASTY Left 11/08/2014   Procedure: TOTAL HIP ARTHROPLASTY ANTERIOR APPROACH;  Surgeon: PMelrose Nakayama MD;  Location: [facility]  Service: Orthopedics;  Laterality: Left;   TOTAL HIP ARTHROPLASTY Left 11/13/2014   Procedure: REVISION OF LEFT HIP ARTHROPLASTY ANTERIOR APPROACH;  Surgeon: PMelrose Nakayama MD;  Location: [facility]  Service: Orthopedics;  Laterality: Left;   TOTAL HIP ARTHROPLASTY Right 02/03/2018   Procedure: TOTAL HIP ARTHROPLASTY ANTERIOR APPROACH;  Surgeon: DMelrose Nakayama MD;  Location: [facility]  Service: Orthopedics;  Laterality: Right;   VEIN LIGATION AND STRIPPING Left at age 76  wisdom teeth extracted  1972   WRIST SURGERY Left at age 76   Current Facility-Administered Medications  Medication Dose Route Frequency Provider Last Rate Last Admin   [START ON 05/14/2022] tranexamic acid (CYKLOKAPRON) 2,000 mg in sodium chloride 0.9 % 50 mL Topical  Application  1,749 mg Topical To OR Melrose Nakayama, MD       Current Outpatient Medications  Medication Sig Dispense Refill Last Dose   amoxicillin (AMOXIL) 500 MG tablet Take 2,000 mg by mouth See admin instructions. Take 4 tablets (2000 mg) by mouth 1 hour prior to dental appointments.      JARDIANCE 25 MG TABS tablet Take 25 mg by mouth in the morning.      lisinopril (ZESTRIL) 20 MG tablet Take 20 mg by mouth daily.      metFORMIN (GLUCOPHAGE) 500 MG tablet Take 500 mg by mouth in the  morning and at bedtime.      simvastatin (ZOCOR) 20 MG tablet Take 20 mg by mouth at bedtime.       TOUJEO SOLOSTAR 300 UNIT/ML Solostar Pen Inject 30 Units into the skin every evening.      Ascorbic Acid (VITAMIN C GUMMIE PO) Take 1 tablet by mouth in the morning.      aspirin EC 81 MG tablet Take 81 mg by mouth every evening.      Cyanocobalamin (VITAMIN B-12 PO) Take 1 tablet by mouth in the morning.      ferrous sulfate 325 (65 FE) MG EC tablet Take 325 mg by mouth every evening.      Multiple Vitamin (MULTIVITAMIN WITH MINERALS) TABS tablet Take 1 tablet by mouth every evening.      Omega-3 Fatty Acids (FISH OIL PO) Take 1 capsule by mouth every evening.      TURMERIC PO Take 1 tablet by mouth in the morning.      VITAMIN D PO Take 1 tablet by mouth at bedtime.      No Known Allergies  Social History   Tobacco Use   Smoking status: Former   Smokeless tobacco: Never   Tobacco comments:    quit smoking in 1993  Substance Use Topics   Alcohol use: Yes    Comment: rarely    Family History  Problem Relation Age of Onset   Breast cancer Mother 65   Prostate cancer Father 40   Lung cancer Father    Melanoma Father    Breast cancer Maternal Aunt        diagnosed in her 95s   Breast cancer Maternal Grandmother        diagnosed younger than 6   Cirrhosis Paternal Grandmother        alcoholic   Cancer Maternal Aunt 89       unknown type   Prostate cancer Cousin 35   Cancer Cousin        unknown type, diagnosed 34s   Cancer Cousin 60       unknown type     Review of Systems  Musculoskeletal:  Positive for arthralgias.       Right knee  All other systems reviewed and are negative.   Objective:  Physical Exam Constitutional:      Appearance: Normal appearance.  HENT:     Head: Normocephalic and atraumatic.     Nose: Nose normal.     Mouth/Throat:     Pharynx: Oropharynx is clear.  Eyes:     Extraocular Movements: Extraocular movements intact.  Pulmonary:      Effort: Pulmonary effort is normal.  Abdominal:     Palpations: Abdomen is soft.  Musculoskeletal:     Cervical back: Normal range of motion.     Comments: Right knee motion is about 5-130.  Left knee moves  about the same.  He has some medial joint line pain and crepitation but no effusion on either side.  Hip motion is good and straight leg raise is negative.  Sensation and motor function are intact in his feet with palpable pulses on both sides.    Skin:    General: Skin is warm and dry.  Neurological:     General: No focal deficit present.     Mental Status: He is alert and oriented to person, place, and time.  Psychiatric:        Mood and Affect: Mood normal.        Behavior: Behavior normal.        Thought Content: Thought content normal.        Judgment: Judgment normal.     Vital signs in last 24 hours:    Labs:   Estimated body mass index is 45.38 kg/m as calculated from the following:   Height as of 05/06/22: 5' 4.8" (1.646 m).   Weight as of 05/06/22: 122.9 kg.   Imaging Review Plain radiographs demonstrate severe degenerative joint disease of the right knee(s). The overall alignment isneutral. The bone quality appears to be good for age and reported activity level.      Assessment/Plan:  End stage primary arthritis, right knee   The patient history, physical examination, clinical judgment of the provider and imaging studies are consistent with end stage degenerative joint disease of the right knee(s) and total knee arthroplasty is deemed medically necessary. The treatment options including medical management, injection therapy arthroscopy and arthroplasty were discussed at length. The risks and benefits of total knee arthroplasty were presented and reviewed. The risks due to aseptic loosening, infection, stiffness, patella tracking problems, thromboembolic complications and other imponderables were discussed. The patient acknowledged the explanation, agreed to  proceed with the plan and consent was signed. Patient is being admitted for inpatient treatment for surgery, pain control, PT, OT, prophylactic antibiotics, VTE prophylaxis, progressive ambulation and ADL's and discharge planning. The patient is planning to be discharged home with home health services  Patient's anticipated LOS is less than 2 midnights, meeting these requirements: - Younger than 77 - Lives within 1 hour of care - Has a competent adult at home to recover with post-op recover - NO history of  - Chronic pain requiring opiods  - Diabetes  - Coronary Artery Disease  - Heart failure  - Heart attack  - Stroke  - DVT/VTE  - Cardiac arrhythmia  - Respiratory Failure/COPD  - Renal failure  - Anemia  - Advanced Liver disease

## 2022-05-13 NOTE — Anesthesia Preprocedure Evaluation (Signed)
Anesthesia Evaluation  Patient identified by MRN, date of birth, ID band Patient awake    Reviewed: Allergy & Precautions, NPO status , Patient's Chart, lab work & pertinent test results  Airway Mallampati: II  TM Distance: >3 FB Neck ROM: Full    Dental  (+) Dental Advisory Given, Teeth Intact   Pulmonary former smoker   breath sounds clear to auscultation       Cardiovascular hypertension, Pt. on medications (-) angina  Rhythm:Regular Rate:Normal  ECG: NSR, LAD, rate 76   Neuro/Psych negative neurological ROS  negative psych ROS   GI/Hepatic negative GI ROS, Neg liver ROS,,,  Endo/Other  diabetes, Type 2, Oral Hypoglycemic Agents  Morbid obesity Pre-diabetes    Renal/GU negative Renal ROS    ED     Musculoskeletal  (+) Arthritis , Osteoarthritis,    Abdominal  (+) + obese  Peds  Hematology  HLD   Anesthesia Other Findings   Reproductive/Obstetrics                             Anesthesia Physical Anesthesia Plan  ASA: 3  Anesthesia Plan: Spinal   Post-op Pain Management: Regional block*   Induction:   PONV Risk Score and Plan: 1 and Treatment may vary due to age or medical condition and Propofol infusion  Airway Management Planned: Natural Airway and Simple Face Mask  Additional Equipment: None  Intra-op Plan:   Post-operative Plan:   Informed Consent: I have reviewed the patients History and Physical, chart, labs and discussed the procedure including the risks, benefits and alternatives for the proposed anesthesia with the patient or authorized representative who has indicated his/her understanding and acceptance.       Plan Discussed with: CRNA  Anesthesia Plan Comments: (Labs reviewed. Platelets acceptable, patient not taking any blood thinning medications. Risks and benefits discussed with patient, patient expressed understanding and wished to proceed.)         Anesthesia Quick Evaluation

## 2022-05-14 ENCOUNTER — Encounter (HOSPITAL_COMMUNITY)
Admission: RE | Disposition: A | Discharge status: Home / Self Care | Payer: Self-pay | Source: Home / Self Care | Attending: Orthopaedic Surgery

## 2022-05-14 ENCOUNTER — Other Ambulatory Visit: Payer: Self-pay

## 2022-05-14 ENCOUNTER — Ambulatory Visit (HOSPITAL_BASED_OUTPATIENT_CLINIC_OR_DEPARTMENT_OTHER): Payer: Managed Care, Other (non HMO) | Admitting: Certified Registered Nurse Anesthetist

## 2022-05-14 ENCOUNTER — Observation Stay (HOSPITAL_COMMUNITY)
Admission: RE | Admit: 2022-05-14 | Discharge: 2022-05-15 | Disposition: A | Discharge status: Home / Self Care | Payer: Managed Care, Other (non HMO) | Attending: Orthopaedic Surgery | Admitting: Orthopaedic Surgery

## 2022-05-14 ENCOUNTER — Encounter (HOSPITAL_COMMUNITY): Payer: Self-pay | Admitting: Orthopaedic Surgery

## 2022-05-14 ENCOUNTER — Ambulatory Visit (HOSPITAL_COMMUNITY): Payer: Managed Care, Other (non HMO) | Admitting: Certified Registered Nurse Anesthetist

## 2022-05-14 DIAGNOSIS — M1711 Unilateral primary osteoarthritis, right knee: Secondary | ICD-10-CM | POA: Diagnosis not present

## 2022-05-14 DIAGNOSIS — Z87891 Personal history of nicotine dependence: Secondary | ICD-10-CM | POA: Diagnosis not present

## 2022-05-14 DIAGNOSIS — E119 Type 2 diabetes mellitus without complications: Secondary | ICD-10-CM | POA: Insufficient documentation

## 2022-05-14 DIAGNOSIS — I10 Essential (primary) hypertension: Secondary | ICD-10-CM | POA: Insufficient documentation

## 2022-05-14 DIAGNOSIS — Z7984 Long term (current) use of oral hypoglycemic drugs: Secondary | ICD-10-CM | POA: Insufficient documentation

## 2022-05-14 DIAGNOSIS — Z96651 Presence of right artificial knee joint: Secondary | ICD-10-CM

## 2022-05-14 DIAGNOSIS — Z79899 Other long term (current) drug therapy: Secondary | ICD-10-CM | POA: Insufficient documentation

## 2022-05-14 DIAGNOSIS — Z96643 Presence of artificial hip joint, bilateral: Secondary | ICD-10-CM | POA: Diagnosis not present

## 2022-05-14 DIAGNOSIS — Z6841 Body Mass Index (BMI) 40.0 and over, adult: Secondary | ICD-10-CM

## 2022-05-14 HISTORY — PX: TOTAL KNEE ARTHROPLASTY: SHX125

## 2022-05-14 LAB — GLUCOSE, CAPILLARY
Glucose-Capillary: 194 mg/dL — ABNORMAL HIGH (ref 70–99)
Glucose-Capillary: 206 mg/dL — ABNORMAL HIGH (ref 70–99)
Glucose-Capillary: 267 mg/dL — ABNORMAL HIGH (ref 70–99)
Glucose-Capillary: 350 mg/dL — ABNORMAL HIGH (ref 70–99)

## 2022-05-14 SURGERY — ARTHROPLASTY, KNEE, TOTAL
Anesthesia: Spinal | Site: Knee | Laterality: Right

## 2022-05-14 MED ORDER — DEXAMETHASONE SODIUM PHOSPHATE 10 MG/ML IJ SOLN
INTRAMUSCULAR | Status: AC
  Filled 2022-05-14: qty 1

## 2022-05-14 MED ORDER — PROPOFOL 500 MG/50ML IV EMUL
INTRAVENOUS | Status: AC
  Filled 2022-05-14: qty 50

## 2022-05-14 MED ORDER — LIDOCAINE HCL (PF) 2 % IJ SOLN
INTRAMUSCULAR | Status: AC
  Filled 2022-05-14: qty 5

## 2022-05-14 MED ORDER — SODIUM CHLORIDE (PF) 0.9 % IJ SOLN
INTRAMUSCULAR | Status: DC | PRN
  Administered 2022-05-14: 30 mL

## 2022-05-14 MED ORDER — MEPIVACAINE HCL (PF) 2 % IJ SOLN
INTRAMUSCULAR | Status: AC
  Filled 2022-05-14: qty 20

## 2022-05-14 MED ORDER — LACTATED RINGERS IV BOLUS
500.0000 mL | Freq: Once | INTRAVENOUS | Status: AC
  Administered 2022-05-14: 500 mL via INTRAVENOUS

## 2022-05-14 MED ORDER — ONDANSETRON HCL 4 MG/2ML IJ SOLN: 4.0000 mg | Freq: Once | INTRAMUSCULAR | Status: DC | PRN

## 2022-05-14 MED ORDER — METFORMIN HCL 500 MG PO TABS
500.0000 mg | ORAL_TABLET | Freq: Two times a day (BID) | ORAL | Status: DC
  Administered 2022-05-15: 500 mg via ORAL
  Filled 2022-05-14: qty 1

## 2022-05-14 MED ORDER — DOCUSATE SODIUM 100 MG PO CAPS
100.0000 mg | ORAL_CAPSULE | Freq: Two times a day (BID) | ORAL | Status: DC
  Administered 2022-05-14 – 2022-05-15 (×2): 100 mg via ORAL
  Filled 2022-05-14 (×2): qty 1

## 2022-05-14 MED ORDER — BISACODYL 5 MG PO TBEC: 5.0000 mg | DELAYED_RELEASE_TABLET | Freq: Every day | ORAL | Status: DC | PRN

## 2022-05-14 MED ORDER — LACTATED RINGERS IV SOLN: INTRAVENOUS | Status: DC

## 2022-05-14 MED ORDER — OXYCODONE HCL 5 MG PO TABS
ORAL_TABLET | ORAL | Status: AC
  Filled 2022-05-14: qty 1

## 2022-05-14 MED ORDER — ONDANSETRON HCL 4 MG/2ML IJ SOLN: 4.0000 mg | Freq: Four times a day (QID) | INTRAMUSCULAR | Status: DC | PRN

## 2022-05-14 MED ORDER — PROPOFOL 1000 MG/100ML IV EMUL
INTRAVENOUS | Status: AC
  Filled 2022-05-14: qty 100

## 2022-05-14 MED ORDER — TRANEXAMIC ACID 1000 MG/10ML IV SOLN
INTRAVENOUS | Status: DC | PRN
  Administered 2022-05-14: 2000 mg via TOPICAL

## 2022-05-14 MED ORDER — ORAL CARE MOUTH RINSE: 15.0000 mL | Freq: Once | OROMUCOSAL | Status: AC

## 2022-05-14 MED ORDER — BUPIVACAINE LIPOSOME 1.3 % IJ SUSP: 20.0000 mL | Freq: Once | INTRAMUSCULAR | Status: DC

## 2022-05-14 MED ORDER — ONDANSETRON HCL 4 MG/2ML IJ SOLN
INTRAMUSCULAR | Status: AC
  Filled 2022-05-14: qty 2

## 2022-05-14 MED ORDER — LACTATED RINGERS IV BOLUS: 250.0000 mL | Freq: Once | INTRAVENOUS | Status: DC

## 2022-05-14 MED ORDER — ACETAMINOPHEN 10 MG/ML IV SOLN
INTRAVENOUS | Status: AC
  Filled 2022-05-14: qty 100

## 2022-05-14 MED ORDER — PHENYLEPHRINE HCL (PRESSORS) 10 MG/ML IV SOLN
INTRAVENOUS | Status: DC | PRN
  Administered 2022-05-14 (×2): 80 ug via INTRAVENOUS

## 2022-05-14 MED ORDER — OXYCODONE HCL 5 MG PO TABS
5.0000 mg | ORAL_TABLET | Freq: Once | ORAL | Status: AC | PRN
  Administered 2022-05-14: 5 mg via ORAL

## 2022-05-14 MED ORDER — OXYCODONE HCL 5 MG/5ML PO SOLN: 5.0000 mg | Freq: Once | ORAL | Status: AC | PRN

## 2022-05-14 MED ORDER — HYDROCODONE-ACETAMINOPHEN 7.5-325 MG PO TABS: 1.0000 | ORAL_TABLET | ORAL | Status: DC | PRN

## 2022-05-14 MED ORDER — FENTANYL CITRATE PF 50 MCG/ML IJ SOSY
25.0000 ug | PREFILLED_SYRINGE | INTRAMUSCULAR | Status: DC | PRN
  Administered 2022-05-14 (×2): 50 ug via INTRAVENOUS

## 2022-05-14 MED ORDER — DIPHENHYDRAMINE HCL 12.5 MG/5ML PO ELIX: 12.5000 mg | ORAL_SOLUTION | ORAL | Status: DC | PRN

## 2022-05-14 MED ORDER — KETOROLAC TROMETHAMINE 15 MG/ML IJ SOLN
7.5000 mg | Freq: Four times a day (QID) | INTRAMUSCULAR | Status: AC
  Administered 2022-05-14 – 2022-05-15 (×4): 7.5 mg via INTRAVENOUS
  Filled 2022-05-14 (×4): qty 1

## 2022-05-14 MED ORDER — FENTANYL CITRATE (PF) 100 MCG/2ML IJ SOLN
INTRAMUSCULAR | Status: DC | PRN
  Administered 2022-05-14: 100 ug via INTRAVENOUS

## 2022-05-14 MED ORDER — MIDAZOLAM HCL 2 MG/2ML IJ SOLN
INTRAMUSCULAR | Status: AC
  Filled 2022-05-14: qty 2

## 2022-05-14 MED ORDER — LISINOPRIL 20 MG PO TABS
20.0000 mg | ORAL_TABLET | Freq: Every day | ORAL | Status: DC
  Administered 2022-05-14 – 2022-05-15 (×2): 20 mg via ORAL
  Filled 2022-05-14 (×2): qty 1

## 2022-05-14 MED ORDER — ACETAMINOPHEN 500 MG PO TABS
500.0000 mg | ORAL_TABLET | Freq: Four times a day (QID) | ORAL | Status: DC
  Administered 2022-05-14 – 2022-05-15 (×3): 500 mg via ORAL
  Filled 2022-05-14 (×4): qty 1

## 2022-05-14 MED ORDER — ACETAMINOPHEN 325 MG PO TABS: 325.0000 mg | ORAL_TABLET | Freq: Four times a day (QID) | ORAL | Status: DC | PRN

## 2022-05-14 MED ORDER — CLONIDINE HCL (ANALGESIA) 100 MCG/ML EP SOLN
EPIDURAL | Status: DC | PRN
  Administered 2022-05-14: 100 ug

## 2022-05-14 MED ORDER — DEXAMETHASONE SODIUM PHOSPHATE 4 MG/ML IJ SOLN
INTRAMUSCULAR | Status: DC | PRN
  Administered 2022-05-14: 10 mg via INTRAVENOUS

## 2022-05-14 MED ORDER — METOCLOPRAMIDE HCL 5 MG/ML IJ SOLN: 5.0000 mg | Freq: Three times a day (TID) | INTRAMUSCULAR | Status: DC | PRN

## 2022-05-14 MED ORDER — ACETAMINOPHEN 160 MG/5ML PO SOLN: 325.0000 mg | ORAL | Status: DC | PRN

## 2022-05-14 MED ORDER — PROPOFOL 10 MG/ML IV BOLUS
INTRAVENOUS | Status: DC | PRN
  Administered 2022-05-14: 200 mg via INTRAVENOUS

## 2022-05-14 MED ORDER — CEFAZOLIN IN SODIUM CHLORIDE 3-0.9 GM/100ML-% IV SOLN
3.0000 g | INTRAVENOUS | Status: AC
  Administered 2022-05-14: 3 g via INTRAVENOUS
  Filled 2022-05-14: qty 100

## 2022-05-14 MED ORDER — BUPIVACAINE-EPINEPHRINE 0.5% -1:200000 IJ SOLN
INTRAMUSCULAR | Status: DC | PRN
  Administered 2022-05-14: 30 mL

## 2022-05-14 MED ORDER — ASPIRIN 81 MG PO CHEW
81.0000 mg | CHEWABLE_TABLET | Freq: Two times a day (BID) | ORAL | Status: DC
  Administered 2022-05-15: 81 mg via ORAL
  Filled 2022-05-14: qty 1

## 2022-05-14 MED ORDER — HYDROCODONE-ACETAMINOPHEN 5-325 MG PO TABS: 1.0000 | ORAL_TABLET | Freq: Four times a day (QID) | ORAL | 0 refills | Status: AC | PRN

## 2022-05-14 MED ORDER — SIMVASTATIN 20 MG PO TABS
20.0000 mg | ORAL_TABLET | Freq: Every day | ORAL | Status: DC
  Administered 2022-05-14: 20 mg via ORAL
  Filled 2022-05-14: qty 1

## 2022-05-14 MED ORDER — ROCURONIUM BROMIDE 100 MG/10ML IV SOLN
INTRAVENOUS | Status: DC | PRN
  Administered 2022-05-14: 60 mg via INTRAVENOUS

## 2022-05-14 MED ORDER — PHENYLEPHRINE HCL-NACL 20-0.9 MG/250ML-% IV SOLN
INTRAVENOUS | Status: DC | PRN
  Administered 2022-05-14: 40 ug/min via INTRAVENOUS

## 2022-05-14 MED ORDER — METHOCARBAMOL 500 MG PO TABS: 500.0000 mg | ORAL_TABLET | Freq: Four times a day (QID) | ORAL | Status: DC | PRN

## 2022-05-14 MED ORDER — TRANEXAMIC ACID-NACL 1000-0.7 MG/100ML-% IV SOLN: 1000.0000 mg | Freq: Once | INTRAVENOUS | Status: AC

## 2022-05-14 MED ORDER — 0.9 % SODIUM CHLORIDE (POUR BTL) OPTIME
TOPICAL | Status: DC | PRN
  Administered 2022-05-14: 1000 mL

## 2022-05-14 MED ORDER — CEFAZOLIN SODIUM-DEXTROSE 2-4 GM/100ML-% IV SOLN
2.0000 g | Freq: Four times a day (QID) | INTRAVENOUS | Status: AC
  Administered 2022-05-14 (×2): 2 g via INTRAVENOUS
  Filled 2022-05-14: qty 100

## 2022-05-14 MED ORDER — TIZANIDINE HCL 4 MG PO TABS: 4.0000 mg | ORAL_TABLET | Freq: Four times a day (QID) | ORAL | 1 refills | Status: AC | PRN

## 2022-05-14 MED ORDER — METHOCARBAMOL 500 MG IVPB - SIMPLE MED
INTRAVENOUS | Status: AC
  Filled 2022-05-14: qty 55

## 2022-05-14 MED ORDER — CHLORHEXIDINE GLUCONATE 0.12 % MT SOLN
15.0000 mL | Freq: Once | OROMUCOSAL | Status: AC
  Administered 2022-05-14: 15 mL via OROMUCOSAL

## 2022-05-14 MED ORDER — SUGAMMADEX SODIUM 200 MG/2ML IV SOLN
INTRAVENOUS | Status: DC | PRN
  Administered 2022-05-14: 200 mg via INTRAVENOUS

## 2022-05-14 MED ORDER — MIDAZOLAM HCL 5 MG/5ML IJ SOLN
INTRAMUSCULAR | Status: DC | PRN
  Administered 2022-05-14: 2 mg via INTRAVENOUS

## 2022-05-14 MED ORDER — KETOROLAC TROMETHAMINE 15 MG/ML IJ SOLN
INTRAMUSCULAR | Status: AC
  Filled 2022-05-14: qty 1

## 2022-05-14 MED ORDER — MENTHOL 3 MG MT LOZG: 1.0000 | LOZENGE | OROMUCOSAL | Status: DC | PRN

## 2022-05-14 MED ORDER — ACETAMINOPHEN 325 MG PO TABS: 325.0000 mg | ORAL_TABLET | ORAL | Status: DC | PRN

## 2022-05-14 MED ORDER — FENTANYL CITRATE (PF) 100 MCG/2ML IJ SOLN
INTRAMUSCULAR | Status: AC
  Filled 2022-05-14: qty 2

## 2022-05-14 MED ORDER — BUPIVACAINE LIPOSOME 1.3 % IJ SUSP
INTRAMUSCULAR | Status: AC
  Filled 2022-05-14: qty 20

## 2022-05-14 MED ORDER — FENTANYL CITRATE PF 50 MCG/ML IJ SOSY
PREFILLED_SYRINGE | INTRAMUSCULAR | Status: AC
  Filled 2022-05-14: qty 2

## 2022-05-14 MED ORDER — SUCCINYLCHOLINE CHLORIDE 200 MG/10ML IV SOSY
PREFILLED_SYRINGE | INTRAVENOUS | Status: DC | PRN
  Administered 2022-05-14: 180 mg via INTRAVENOUS

## 2022-05-14 MED ORDER — MORPHINE SULFATE (PF) 2 MG/ML IV SOLN
0.5000 mg | INTRAVENOUS | Status: DC | PRN
  Administered 2022-05-14 (×2): 1 mg via INTRAVENOUS

## 2022-05-14 MED ORDER — BUPIVACAINE-EPINEPHRINE (PF) 0.5% -1:200000 IJ SOLN
INTRAMUSCULAR | Status: AC
  Filled 2022-05-14: qty 30

## 2022-05-14 MED ORDER — INSULIN GLARGINE 100 UNIT/ML ~~LOC~~ SOLN
30.0000 [IU] | Freq: Every day | SUBCUTANEOUS | Status: DC
  Filled 2022-05-14: qty 0.3

## 2022-05-14 MED ORDER — TRANEXAMIC ACID-NACL 1000-0.7 MG/100ML-% IV SOLN
INTRAVENOUS | Status: AC
  Filled 2022-05-14: qty 100

## 2022-05-14 MED ORDER — CEFAZOLIN SODIUM-DEXTROSE 2-4 GM/100ML-% IV SOLN
INTRAVENOUS | Status: AC
  Administered 2022-05-14: 2000 mg
  Filled 2022-05-14: qty 100

## 2022-05-14 MED ORDER — ACETAMINOPHEN 10 MG/ML IV SOLN
1000.0000 mg | Freq: Once | INTRAVENOUS | Status: DC | PRN
  Administered 2022-05-14: 1000 mg via INTRAVENOUS

## 2022-05-14 MED ORDER — INSULIN ASPART 100 UNIT/ML IJ SOLN
0.0000 [IU] | Freq: Three times a day (TID) | INTRAMUSCULAR | Status: DC
  Administered 2022-05-14 – 2022-05-15 (×2): 11 [IU] via SUBCUTANEOUS
  Administered 2022-05-15: 4 [IU] via SUBCUTANEOUS

## 2022-05-14 MED ORDER — CEFAZOLIN SODIUM-DEXTROSE 2-4 GM/100ML-% IV SOLN: 2.0000 g | INTRAVENOUS | Status: DC

## 2022-05-14 MED ORDER — KETOROLAC TROMETHAMINE 15 MG/ML IJ SOLN
15.0000 mg | Freq: Once | INTRAMUSCULAR | Status: AC
  Administered 2022-05-14: 15 mg via INTRAVENOUS

## 2022-05-14 MED ORDER — ONDANSETRON HCL 4 MG/2ML IJ SOLN
INTRAMUSCULAR | Status: DC | PRN
  Administered 2022-05-14: 4 mg via INTRAVENOUS

## 2022-05-14 MED ORDER — ALUM & MAG HYDROXIDE-SIMETH 200-200-20 MG/5ML PO SUSP: 30.0000 mL | ORAL | Status: DC | PRN

## 2022-05-14 MED ORDER — SODIUM CHLORIDE (PF) 0.9 % IJ SOLN
INTRAMUSCULAR | Status: AC
  Filled 2022-05-14: qty 30

## 2022-05-14 MED ORDER — POVIDONE-IODINE 10 % EX SWAB
2.0000 | Freq: Once | CUTANEOUS | Status: AC
  Administered 2022-05-14: 2 via TOPICAL

## 2022-05-14 MED ORDER — METHOCARBAMOL 500 MG IVPB - SIMPLE MED
500.0000 mg | Freq: Four times a day (QID) | INTRAVENOUS | Status: DC | PRN
  Administered 2022-05-14: 500 mg via INTRAVENOUS

## 2022-05-14 MED ORDER — SODIUM CHLORIDE 0.9 % IR SOLN
Status: DC | PRN
  Administered 2022-05-14: 1000 mL

## 2022-05-14 MED ORDER — HYDROCODONE-ACETAMINOPHEN 5-325 MG PO TABS: 1.0000 | ORAL_TABLET | ORAL | Status: DC | PRN

## 2022-05-14 MED ORDER — MORPHINE SULFATE (PF) 2 MG/ML IV SOLN
INTRAVENOUS | Status: AC
  Filled 2022-05-14: qty 1

## 2022-05-14 MED ORDER — LACTATED RINGERS IV BOLUS
250.0000 mL | Freq: Once | INTRAVENOUS | Status: AC
  Administered 2022-05-14: 250 mL via INTRAVENOUS

## 2022-05-14 MED ORDER — PHENOL 1.4 % MT LIQD: 1.0000 | OROMUCOSAL | Status: DC | PRN

## 2022-05-14 MED ORDER — BUPIVACAINE LIPOSOME 1.3 % IJ SUSP
INTRAMUSCULAR | Status: DC | PRN
  Administered 2022-05-14: 20 mL

## 2022-05-14 MED ORDER — TRANEXAMIC ACID-NACL 1000-0.7 MG/100ML-% IV SOLN
1000.0000 mg | INTRAVENOUS | Status: AC
  Administered 2022-05-14: 1000 mg via INTRAVENOUS
  Filled 2022-05-14: qty 100

## 2022-05-14 MED ORDER — INSULIN GLARGINE-YFGN 100 UNIT/ML ~~LOC~~ SOLN
30.0000 [IU] | Freq: Every day | SUBCUTANEOUS | Status: DC
  Administered 2022-05-14: 30 [IU] via SUBCUTANEOUS
  Filled 2022-05-14 (×2): qty 0.3

## 2022-05-14 MED ORDER — ROPIVACAINE HCL 5 MG/ML IJ SOLN
INTRAMUSCULAR | Status: DC | PRN
  Administered 2022-05-14 (×6): 5 mL via PERINEURAL

## 2022-05-14 MED ORDER — METOCLOPRAMIDE HCL 5 MG PO TABS: 5.0000 mg | ORAL_TABLET | Freq: Three times a day (TID) | ORAL | Status: DC | PRN

## 2022-05-14 MED ORDER — EMPAGLIFLOZIN 25 MG PO TABS
25.0000 mg | ORAL_TABLET | Freq: Every day | ORAL | Status: DC
  Administered 2022-05-15: 25 mg via ORAL
  Filled 2022-05-14: qty 1

## 2022-05-14 MED ORDER — ONDANSETRON HCL 4 MG PO TABS: 4.0000 mg | ORAL_TABLET | Freq: Four times a day (QID) | ORAL | Status: DC | PRN

## 2022-05-14 MED ORDER — ASPIRIN 81 MG PO TBEC: 81.0000 mg | DELAYED_RELEASE_TABLET | Freq: Two times a day (BID) | ORAL | 0 refills | Status: AC

## 2022-05-14 SURGICAL SUPPLY — 57 items
ATTUNE MED DOME PAT 41 KNEE (Knees) IMPLANT
ATTUNE PS FEM RT SZ 7 CEM KNEE (Femur) IMPLANT
ATTUNE PSRP INSR SZ7 10 KNEE (Insert) IMPLANT
BAG COUNTER SPONGE SURGICOUNT (BAG) ×1 IMPLANT
BAG DECANTER FOR FLEXI CONT (MISCELLANEOUS) ×1 IMPLANT
BAG SPEC THK2 15X12 ZIP CLS (MISCELLANEOUS) ×1
BAG SPNG CNTER NS LX DISP (BAG) ×1
BAG ZIPLOCK 12X15 (MISCELLANEOUS) ×1 IMPLANT
BASE TIBIAL ROT PLAT SZ 8 KNEE (Knees) IMPLANT
BLADE SAGITTAL 25.0X1.19X90 (BLADE) ×1 IMPLANT
BLADE SAW SGTL 11.0X1.19X90.0M (BLADE) ×1 IMPLANT
BLADE SURG SZ10 CARB STEEL (BLADE) ×1 IMPLANT
BNDG CMPR MED 10X6 ELC LF (GAUZE/BANDAGES/DRESSINGS) ×1
BNDG ELASTIC 6X10 VLCR STRL LF (GAUZE/BANDAGES/DRESSINGS) IMPLANT
BNDG ELASTIC 6X5.8 VLCR STR LF (GAUZE/BANDAGES/DRESSINGS) ×1 IMPLANT
BOOTIES KNEE HIGH SLOAN (MISCELLANEOUS) ×1 IMPLANT
BOWL SMART MIX CTS (DISPOSABLE) ×1 IMPLANT
BSPLAT TIB 8 CMNT ROT PLAT STR (Knees) ×1 IMPLANT
CEMENT HV SMART SET (Cement) ×2 IMPLANT
COVER SURGICAL LIGHT HANDLE (MISCELLANEOUS) ×1 IMPLANT
CUFF TOURN SGL QUICK 34 (TOURNIQUET CUFF) ×1
CUFF TRNQT CYL 34X4.125X (TOURNIQUET CUFF) ×1 IMPLANT
DRAPE INCISE IOBAN 66X45 STRL (DRAPES) ×1 IMPLANT
DRAPE SHEET LG 3/4 BI-LAMINATE (DRAPES) ×1 IMPLANT
DRAPE TOP 10253 STERILE (DRAPES) ×1 IMPLANT
DRAPE U-SHAPE 47X51 STRL (DRAPES) ×1 IMPLANT
DRSG AQUACEL AG ADV 3.5X10 (GAUZE/BANDAGES/DRESSINGS) ×1 IMPLANT
DURAPREP 26ML APPLICATOR (WOUND CARE) ×2 IMPLANT
ELECT REM PT RETURN 15FT ADLT (MISCELLANEOUS) ×1 IMPLANT
GLOVE BIO SURGEON STRL SZ8 (GLOVE) ×2 IMPLANT
GLOVE BIOGEL PI IND STRL 8 (GLOVE) ×2 IMPLANT
GOWN STRL REUS W/ TWL XL LVL3 (GOWN DISPOSABLE) ×2 IMPLANT
GOWN STRL REUS W/TWL XL LVL3 (GOWN DISPOSABLE) ×2
HANDPIECE INTERPULSE COAX TIP (DISPOSABLE) ×1
HOLDER FOLEY CATH W/STRAP (MISCELLANEOUS) IMPLANT
HOOD PEEL AWAY T7 (MISCELLANEOUS) ×3 IMPLANT
KIT TURNOVER KIT A (KITS) IMPLANT
MANIFOLD NEPTUNE II (INSTRUMENTS) ×1 IMPLANT
NEEDLE HYPO 22GX1.5 SAFETY (NEEDLE) ×1 IMPLANT
NS IRRIG 1000ML POUR BTL (IV SOLUTION) ×1 IMPLANT
PACK TOTAL KNEE CUSTOM (KITS) ×1 IMPLANT
PAD ARMBOARD 7.5X6 YLW CONV (MISCELLANEOUS) ×1 IMPLANT
PIN STEINMAN FIXATION KNEE (PIN) IMPLANT
PROTECTOR NERVE ULNAR (MISCELLANEOUS) ×1 IMPLANT
SET HNDPC FAN SPRY TIP SCT (DISPOSABLE) ×1 IMPLANT
SPIKE FLUID TRANSFER (MISCELLANEOUS) ×2 IMPLANT
SUT ETHIBOND NAB CT1 #1 30IN (SUTURE) ×2 IMPLANT
SUT VIC AB 0 CT1 36 (SUTURE) ×1 IMPLANT
SUT VIC AB 2-0 CT1 27 (SUTURE) ×1
SUT VIC AB 2-0 CT1 TAPERPNT 27 (SUTURE) ×1 IMPLANT
SUT VICRYL AB 3-0 FS1 BRD 27IN (SUTURE) ×1 IMPLANT
SUT VLOC 180 0 24IN GS25 (SUTURE) ×1 IMPLANT
TIBIAL BASE ROT PLAT SZ 8 KNEE (Knees) ×1 IMPLANT
TRAY FOLEY MTR SLVR 16FR STAT (SET/KITS/TRAYS/PACK) IMPLANT
WATER STERILE IRR 1000ML POUR (IV SOLUTION) ×1 IMPLANT
WRAP KNEE MAXI GEL POST OP (GAUZE/BANDAGES/DRESSINGS) ×1 IMPLANT
YANKAUER SUCT BULB TIP NO VENT (SUCTIONS) ×1 IMPLANT

## 2022-05-14 NOTE — Op Note (Signed)
PREOP DIAGNOSIS: DJD RIGHT KNEE POSTOP DIAGNOSIS: same PROCEDURE: RIGHT TKR ANESTHESIA: General ATTENDING SURGEON: Hessie Dibble ASSISTANT: Loni Dolly PA  INDICATIONS FOR PROCEDURE: [name] is a 76 y.o. male who has struggled for a long time with pain due to degenerative arthritis of the right knee.  The patient has failed many conservative non-operative measures and at this point has pain which limits the ability to sleep and walk.  The patient is offered total knee replacement.  Informed operative consent was obtained after discussion of possible risks of anesthesia, infection, neurovascular injury, DVT, and death.  The importance of the post-operative rehabilitation protocol to optimize result was stressed extensively with the patient.  SUMMARY OF FINDINGS AND PROCEDURE:  [name] was taken to the operative suite where under the above anesthesia a right knee replacement was performed.  There were advanced degenerative changes and the bone quality was excellent.  We used the DePuy Attune system and placed size 7 femur, 8 tibia, 41 mm all polyethylene patella, and a size 10 mm spacer.  Loni Dolly PA-C assisted throughout and was invaluable to the completion of the case in that he helped retract and maintain exposure while I placed components.  He also helped close thereby minimizing OR time.  The patient was admitted for appropriate post-op care to include perioperative antibiotics and mechanical and pharmacologic measures for DVT prophylaxis.  DESCRIPTION OF PROCEDURE:  [name] was taken to the operative suite where the above anesthesia was applied.  The patient was positioned supine and prepped and draped in normal sterile fashion.  An appropriate time out was performed.  After the administration of kefzol pre-op antibiotic the leg was elevated and exsanguinated and a tourniquet inflated. A standard longitudinal incision was made on the anterior knee.  Dissection was carried down  to the extensor mechanism.  All appropriate anti-infective measures were used including the pre-operative antibiotic, betadine impregnated drape, and closed hooded exhaust systems for each member of the surgical team.  A medial parapatellar incision was made in the extensor mechanism and the knee cap flipped and the knee flexed.  Some residual meniscal tissues were removed along with any remaining ACL/PCL tissue.  A guide was placed on the tibia and a flat cut was made on it's superior surface.  An intramedullary guide was placed in the femur and was utilized to make anterior and posterior cuts creating an appropriate flexion gap.  A second intramedullary guide was placed in the femur to make a distal cut properly balancing the knee with an extension gap equal to the flexion gap.  The three bones sized to the above mentioned sizes and the appropriate guides were placed and utilized.  A trial reduction was done and the knee easily came to full extension and the patella tracked well on flexion.  The trial components were removed and all bones were cleaned with pulsatile lavage and then dried thoroughly.  Cement was mixed and was pressurized onto the bones followed by placement of the aforementioned components.  Excess cement was trimmed and pressure was held on the components until the cement had hardened.  The tourniquet was deflated and a small amount of bleeding was controlled with cautery and pressure.  The knee was irrigated thoroughly.  The extensor mechanism was re-approximated with #1 ethibond in interrupted fashion.  The knee was flexed and the repair was solid.  The subcutaneous tissues were re-approximated with #0 and #2-0 vicryl and the skin closed with a subcuticular  stitch and steristrips.  A sterile dressing was applied.  Intraoperative fluids, EBL, and tourniquet time can be obtained from anesthesia records.  DISPOSITION:  The patient was taken to recovery room in stable condition and scheduled to  potentially go home same day depending on ability to walk and tolerate liquids.[name] 05/14/2022, 9:32 AM

## 2022-05-14 NOTE — Anesthesia Postprocedure Evaluation (Signed)
Anesthesia Post Note  Patient: [name]  Procedure(s) Performed: RIGHT TOTAL KNEE ARTHROPLASTY (Right: Knee)     Patient location during evaluation: Phase II Anesthesia Type: Spinal Level of consciousness: awake Pain management: pain level controlled Vital Signs Assessment: post-procedure vital signs reviewed and stable Respiratory status: spontaneous breathing Cardiovascular status: stable Postop Assessment: no headache, no backache, patient able to bend at knees and no apparent nausea or vomiting Anesthetic complications: no  No notable events documented.  Last Vitals:  Vitals:   05/14/22 1200 05/14/22 1230  BP: 122/75   Pulse: (!) 53 69  Resp: 16   Temp:    SpO2: 96% 94%    Last Pain:  Vitals:   05/14/22 1230  TempSrc:   PainSc: [name]

## 2022-05-14 NOTE — Anesthesia Procedure Notes (Signed)
Anesthesia Regional Block: Adductor canal block   Pre-Anesthetic Checklist: , timeout performed,  Correct Patient, Correct Site, Correct Laterality,  Correct Procedure, Correct Position, site marked,  Risks and benefits discussed,  Surgical consent,  Pre-op evaluation,  At surgeon's request and post-op pain management  Laterality: Lower and Right  Prep: chloraprep       Needles:  Injection technique: Single-shot  Needle Type: Echogenic Stimulator Needle     Needle Length: 9cm  Needle Gauge: 20   Needle insertion depth: 3 cm   Additional Needles:   Procedures:,,,, ultrasound used (permanent image in chart),,    Narrative:  Start time: 05/14/2022 7:00 AM End time: 05/14/2022 7:08 AM Injection made incrementally with aspirations every 5 mL.  Performed by: Personally  Anesthesiologist: Lyn Hollingshead, MD

## 2022-05-14 NOTE — Anesthesia Procedure Notes (Addendum)
Procedure Name: Intubation Date/Time: 05/14/2022 8:04 AM  Performed by: Bufford Spikes, CRNAPre-anesthesia Checklist: Patient identified, Emergency Drugs available, Suction available and Patient being monitored Patient Re-evaluated:Patient Re-evaluated prior to induction Oxygen Delivery Method: Circle system utilized Preoxygenation: Pre-oxygenation with 100% oxygen Induction Type: IV induction Ventilation: Mask ventilation without difficulty Laryngoscope Size: Glidescope and 4 Tube type: Oral Tube size: 7.5 mm Number of attempts: 1 Airway Equipment and Method: Stylet and Oral airway Placement Confirmation: ETT inserted through vocal cords under direct vision, positive ETCO2 and breath sounds checked- equal and bilateral Secured at: 22 cm Tube secured with: Tape Dental Injury: Teeth and Oropharynx as per pre-operative assessment

## 2022-05-14 NOTE — Evaluation (Addendum)
Physical Therapy Evaluation Patient Details Name: [name] MRN: [id number] DOB: January 18, 1946 Today's Date: 05/14/2022  History of Present Illness  Pt is a 76yo male presenting s/p R-TKA on 05/14/22. PMH: DM, HLD, HTN, L-THA s/p revision, R-THA 2019.  Clinical Impression  [name] is a 76 y.o. male POD 0 s/p R-TKA. Patient reports modified independence using SPC with mobility at baseline. Patient is now limited by functional impairments (see PT problem list below) and requires supervision for bed mobility and min guard for transfers; further mobility limited by lightheadness and soft BP, RN notified. Patient instructed in exercise to facilitate ROM and circulation to manage edema. Provided incentive spirometer and with Vcs pt able to achieve 2518m. Patient will benefit from continued skilled PT interventions to address impairments and progress towards PLOF. Acute PT will follow to progress mobility and stair training in preparation for safe discharge home.       Recommendations for follow up therapy are one component of a multi-disciplinary discharge planning process, led by the attending physician.  Recommendations may be updated based on patient status, additional functional criteria and insurance authorization.  Follow Up Recommendations Follow physician's recommendations for discharge plan and follow up therapies      Assistance Recommended at Discharge Frequent or constant Supervision/Assistance  Patient can return home with the following  A little help with walking and/or transfers;A little help with bathing/dressing/bathroom;Assistance with cooking/housework;Assist for transportation;Help with stairs or ramp for entrance    Equipment Recommendations None recommended by PT  Recommendations for Other Services       Functional Status Assessment Patient has had a recent decline in their functional status and demonstrates the ability to make significant improvements in function in  a reasonable and predictable amount of time.     Precautions / Restrictions Precautions Precautions: Knee Precaution Booklet Issued: No Precaution Comments: no pillow under the knee Restrictions Weight Bearing Restrictions: No Other Position/Activity Restrictions: wbat      Mobility  Bed Mobility Overal bed mobility: Needs Assistance Bed Mobility: Supine to Sit     Supine to sit: HOB elevated, Supervision     General bed mobility comments: For safety only, VCs for use of bed rails.    Transfers Overall transfer level: Needs assistance Equipment used: Rolling walker (2 wheels) Transfers: Sit to/from Stand, Bed to chair/wheelchair/BSC Sit to Stand: Min assist, From elevated surface   Step pivot transfers: Min assist       General transfer comment: Sit to stand: pt required min assist for bringing weight forward and steadying RW as pt pulled up on RW despite cues to push up off bed wtih BUE. Upon standing, Pt reporting lightheadedness, returned pt to sitting EOB, BP 108/63(76), HR 66, RN notified. Pt completed step pivot transfer with min assist for steadying, VCs for sequencing, no lightheadedness. Further mobility deferred.    Ambulation/Gait               General Gait Details: deferred  Stairs            Wheelchair Mobility    Modified Rankin (Stroke Patients Only)       Balance Overall balance assessment: Needs assistance Sitting-balance support: Feet supported, No upper extremity supported Sitting balance-Leahy Scale: Good     Standing balance support: Reliant on assistive device for balance, During functional activity, Bilateral upper extremity supported Standing balance-Leahy Scale: Poor  Pertinent Vitals/Pain Pain Assessment Pain Assessment: 0-10 Pain Score: 1  Pain Location: right knee Pain Descriptors / Indicators: Operative site guarding Pain Intervention(s): Limited activity within  patient's tolerance, Monitored during session, Repositioned, Ice applied    Home Living Family/patient expects to be discharged to:: Private residence Living Arrangements: Spouse/significant other;Children Available Help at Discharge: Family;Available 24 hours/day (Wife is retired Radio producer) Type of Home: House Home Access: Stairs to enter Entrance Stairs-Rails: None Technical brewer of Steps: 3 Alternate Level Stairs-Number of Steps: 12 Home Layout: Two level;Able to live on main level with bedroom/bathroom Home Equipment: Rolling Walker (2 wheels);Toilet riser;Shower seat;Crutches;Cane - single point      Prior Function Prior Level of Function : Working/employed;Driving;Independent/Modified Independent (IT Specialist)             Mobility Comments: SPC for community mobility, fly fishing ADLs Comments: IND     Hand Dominance        Extremity/Trunk Assessment   Upper Extremity Assessment Upper Extremity Assessment: Overall WFL for tasks assessed    Lower Extremity Assessment Lower Extremity Assessment: RLE deficits/detail;LLE deficits/detail RLE Deficits / Details: MMT ank DF/PF 5/5, No extensor lag noted RLE Sensation: WNL LLE Deficits / Details: MMT ank DF/PF 5/5 LLE Sensation: WNL    Cervical / Trunk Assessment Cervical / Trunk Assessment: Normal  Communication   Communication: No difficulties  Cognition Arousal/Alertness: Awake/alert Behavior During Therapy: WFL for tasks assessed/performed Overall Cognitive Status: Within Functional Limits for tasks assessed                                          General Comments      Exercises Total Joint Exercises Ankle Circles/Pumps: AROM, Both, 20 reps   Assessment/Plan    PT Assessment Patient needs continued PT services  PT Problem List Decreased strength;Decreased range of motion;Decreased activity tolerance;Decreased balance;Decreased mobility;Decreased coordination;Pain        PT Treatment Interventions DME instruction;Gait training;Stair training;Functional mobility training;Therapeutic activities;Therapeutic exercise;Balance training;Patient/family education;Neuromuscular re-education    PT Goals (Current goals can be found in the Care Plan section)  Acute Rehab PT Goals Patient Stated Goal: Get back to fly fishing PT Goal Formulation: With patient Time For Goal Achievement: 05/21/22 Potential to Achieve Goals: Good    Frequency 7X/week     Co-evaluation               AM-PAC PT "6 Clicks" Mobility  Outcome Measure Help needed turning from your back to your side while in a flat bed without using bedrails?: None Help needed moving from lying on your back to sitting on the side of a flat bed without using bedrails?: A Little Help needed moving to and from a bed to a chair (including a wheelchair)?: A Little Help needed standing up from a chair using your arms (e.g., wheelchair or bedside chair)?: A Little Help needed to walk in hospital room?: A Little Help needed climbing 3-5 steps with a railing? : A Little 6 Click Score: 19    End of Session Equipment Utilized During Treatment: Gait belt Activity Tolerance: Patient tolerated treatment well;No increased pain Patient left: in chair;with call bell/phone within reach;with chair alarm set;with SCD's reapplied Nurse Communication: Mobility status PT Visit Diagnosis: Pain;Difficulty in walking, not elsewhere classified (R26.2) Pain - Right/Left: Right Pain - part of body: Knee    Time: 0867-6195 PT Time Calculation (min) (ACUTE ONLY): 27  min   Charges:   PT Evaluation $PT Eval Low Complexity: 1 Low PT Treatments $Therapeutic Exercise: 8-22 mins        Coolidge Breeze, PT, DPT [facility] Office: [phone] Weekend pager: [phone]  Coolidge Breeze 05/14/2022, 4:36 PM

## 2022-05-14 NOTE — Transfer of Care (Signed)
Immediate Anesthesia Transfer of Care Note  Patient: [name]  Procedure(s) Performed: RIGHT TOTAL KNEE ARTHROPLASTY (Right: Knee)  Patient Location: PACU  Anesthesia Type:GA combined with regional for post-op pain  Level of Consciousness: awake, alert , and oriented  Airway & Oxygen Therapy: Patient Spontanous Breathing and Patient connected to face mask oxygen  Post-op Assessment: Report given to RN and Post -op Vital signs reviewed and stable  Post vital signs: Reviewed and stable  Last Vitals:  Vitals Value Taken Time  BP 117/74 05/14/22 1000  Temp    Pulse 66 05/14/22 1000  Resp 28 05/14/22 1000  SpO2 93 % 05/14/22 1000  Vitals shown include unvalidated device data.  Last Pain:  Vitals:   05/14/22 0603  TempSrc: Oral  PainSc:          Complications: No notable events documented.

## 2022-05-14 NOTE — Plan of Care (Signed)
  Problem: Nutritional: Goal: Maintenance of adequate nutrition will improve Outcome: Progressing   Problem: Activity: Goal: Ability to avoid complications of mobility impairment will improve Outcome: Progressing Goal: Range of joint motion will improve Outcome: Progressing   Problem: Pain Management: Goal: Pain level will decrease with appropriate interventions Outcome: Progressing

## 2022-05-15 ENCOUNTER — Encounter (HOSPITAL_COMMUNITY): Payer: Self-pay | Admitting: Orthopaedic Surgery

## 2022-05-15 ENCOUNTER — Other Ambulatory Visit: Payer: Self-pay

## 2022-05-15 DIAGNOSIS — M1711 Unilateral primary osteoarthritis, right knee: Secondary | ICD-10-CM | POA: Diagnosis not present

## 2022-05-15 LAB — GLUCOSE, CAPILLARY
Glucose-Capillary: 273 mg/dL — ABNORMAL HIGH (ref 70–99)
Glucose-Capillary: 186 mg/dL — ABNORMAL HIGH (ref 70–99)

## 2022-05-15 NOTE — Progress Notes (Signed)
Subjective: 1 Day Post-Op Procedure(s) (LRB): RIGHT TOTAL KNEE ARTHROPLASTY (Right)  Patient doing great. He has no pain. He is hoping to go home today.   Activity level:  wbat Diet tolerance:  ok Voiding:  ok Patient reports pain as mild.    Objective: Vital signs in last 24 hours: Temp:  [97.5 F (36.4 C)-98 F (36.7 C)] 97.5 F (36.4 C) (11/15 0533) Pulse Rate:  [53-79] 64 (11/15 0533) Resp:  [12-22] 17 (11/15 0533) BP: (99-155)/(59-93) 100/59 (11/15 0533) SpO2:  [92 %-97 %] 96 % (11/15 0533)  Labs: No results for input(s): "HGB" in the last 72 hours. No results for input(s): "WBC", "RBC", "HCT", "PLT" in the last 72 hours. No results for input(s): "NA", "K", "CL", "CO2", "BUN", "CREATININE", "GLUCOSE", "CALCIUM" in the last 72 hours. No results for input(s): "LABPT", "INR" in the last 72 hours.  Physical Exam:  Neurologically intact ABD soft Neurovascular intact Sensation intact distally Intact pulses distally Dorsiflexion/Plantar flexion intact Incision: dressing C/D/I and no drainage No cellulitis present Compartment soft  Assessment/Plan:  1 Day Post-Op Procedure(s) (LRB): RIGHT TOTAL KNEE ARTHROPLASTY (Right) Advance diet Up with therapy D/C IV fluids Discharge home with home health today if cleared by PT and doing well. Follow up in office 2 weeks post op. '81mg'$  asa BID for dvt prevention.    Larwance Sachs Kylani Wires 05/15/2022, 8:16 AM

## 2022-05-15 NOTE — Progress Notes (Signed)
Patient completed am therapy session, noted large amount of sanguinous drainage to dressing and leaking from side of dressing. Aquacel removed. Incision cleaned, new aquacel applied. Applied abd pad and paper tape for pressure dressing. Will monitor for drainage during second therapy session.

## 2022-05-15 NOTE — Progress Notes (Signed)
Physical Therapy Treatment Patient Details Name: [name] MRN: [id number] DOB: 06-Nov-1945 Today's Date: 05/15/2022   History of Present Illness Pt is a 76yo male presenting s/p R-TKA on 05/14/22. PMH: DM, HLD, HTN, L-THA s/p revision, R-THA 2019.    PT Comments    POD # 1 pm session with spouse of stair training. General stair comments: 50% VC's on proper tech up backward.  Spouse present.  Also Son on [place] also educated/observered as he is meeting them at house to help. Addressed all mobility questions, discussed appropriate activity, educated on use of ICE.  Pt ready for D/C to home.   Recommendations for follow up therapy are one component of a multi-disciplinary discharge planning process, led by the attending physician.  Recommendations may be updated based on patient status, additional functional criteria and insurance authorization.  Follow Up Recommendations  Follow physician's recommendations for discharge plan and follow up therapies     Assistance Recommended at Discharge Frequent or constant Supervision/Assistance  Patient can return home with the following A little help with walking and/or transfers;A little help with bathing/dressing/bathroom;Assistance with cooking/housework;Assist for transportation;Help with stairs or ramp for entrance   Equipment Recommendations  None recommended by PT    Recommendations for Other Services       Precautions / Restrictions Precautions Precautions: Knee Restrictions Weight Bearing Restrictions: No Other Position/Activity Restrictions: WBAT     Mobility  Bed Mobility Overal bed mobility: Needs Assistance Bed Mobility: Sit to Supine       Sit to supine: Supervision, Min guard   General bed mobility comments: OOB in recliner    Transfers Overall transfer level: Needs assistance Equipment used: Rolling walker (2 wheels) Transfers: Sit to/from Stand, Bed to chair/wheelchair/BSC Sit to Stand: Supervision            General transfer comment: good hand placement and self able    Ambulation/Gait Ambulation/Gait assistance: Supervision, Min guard Gait Distance (Feet): 12 Feet Assistive device: Rolling walker (2 wheels) Gait Pattern/deviations: Step-to pattern, Decreased stance time - right Gait velocity: decreased     General Gait Details: self able to amb a functional distance to hallway to practice stairs with Spouse   Stairs Stairs: Yes Stairs assistance: Min assist Stair Management: No rails Number of Stairs: 2 General stair comments: 50% VC's on proper tech up backward.  Spouse present.  Also Son on [place] also educated/observered as he is meeting them at house to help.   Wheelchair Mobility    Modified Rankin (Stroke Patients Only)       Balance                                            Cognition Arousal/Alertness: Awake/alert Behavior During Therapy: WFL for tasks assessed/performed Overall Cognitive Status: Within Functional Limits for tasks assessed                                 General Comments: AxO x 3 very pleasant and motivated.  Did require repaet VC's on "new" information as well as handout.        Exercises      General Comments        Pertinent Vitals/Pain Pain Assessment Pain Assessment: 0-10 Pain Score: 3  Faces Pain Scale: Hurts a little bit Pain Location: right knee  Pain Descriptors / Indicators: Operative site guarding Pain Intervention(s): Monitored during session, Premedicated before session, Repositioned, Ice applied    Home Living                          Prior Function            PT Goals (current goals can now be found in the care plan section) Progress towards PT goals: Progressing toward goals    Frequency    7X/week      PT Plan Current plan remains appropriate    Co-evaluation              AM-PAC PT "6 Clicks" Mobility   Outcome Measure  Help needed  turning from your back to your side while in a flat bed without using bedrails?: A Little Help needed moving from lying on your back to sitting on the side of a flat bed without using bedrails?: A Little Help needed moving to and from a bed to a chair (including a wheelchair)?: A Little Help needed standing up from a chair using your arms (e.g., wheelchair or bedside chair)?: A Little Help needed to walk in hospital room?: A Little Help needed climbing 3-5 steps with a railing? : A Little 6 Click Score: 18    End of Session Equipment Utilized During Treatment: Gait belt Activity Tolerance: Patient tolerated treatment well;No increased pain Patient left: in chair;with call bell/phone within reach Nurse Communication: Mobility status PT Visit Diagnosis: Pain;Difficulty in walking, not elsewhere classified (R26.2) Pain - Right/Left: Right Pain - part of body: Knee     Time: 7412-8786 PT Time Calculation (min) (ACUTE ONLY): 19 min  Charges:  $Gait Training: 8-22 mins                    Rica Koyanagi  PTA [place] [facility] M-F          [phone] Weekend pager [phone]

## 2022-05-15 NOTE — Progress Notes (Signed)
Patient discharged to home w/ wife. Given all belongings, instructions. Verbalized understanding of instructions. Dressing clean/intact. Escorted to pov via w/c.

## 2022-05-15 NOTE — Progress Notes (Signed)
Physical Therapy Treatment Patient Details Name: [name] MRN: [id number] DOB: 1946/01/08 Today's Date: 05/15/2022   History of Present Illness Pt is a 76yo male presenting s/p R-TKA on 05/14/22. PMH: DM, HLD, HTN, L-THA s/p revision, R-THA 2019.    PT Comments    POD # 1 am session Pt already OOB in recliner.  Assisted with amb in hallway while monitoring BP's.  Seated BP 107/65.  Amb 20 feet BP 138/82 feeling "better" today . General transfer comment: 25% VC's on proper hand placement and increased time to rise from lower level recliner.  Upond standing, pt reports NO dizziness.  General Gait Details: tolerated amb 28 feet in hallway with NO c/o dizziness and 25% VC's on proper sequencing as well as safety with turns. Then returned to room to perform some TE's following HEP handout.  Instructed on proper tech, freq as well as use of ICE.   Pt will need another PT session to practice stairs with family.    Recommendations for follow up therapy are one component of a multi-disciplinary discharge planning process, led by the attending physician.  Recommendations may be updated based on patient status, additional functional criteria and insurance authorization.  Follow Up Recommendations  Follow physician's recommendations for discharge plan and follow up therapies (pt stated [place] first then OP)     Assistance Recommended at Discharge Frequent or constant Supervision/Assistance  Patient can return home with the following A little help with walking and/or transfers;A little help with bathing/dressing/bathroom;Assistance with cooking/housework;Assist for transportation;Help with stairs or ramp for entrance   Equipment Recommendations  None recommended by PT    Recommendations for Other Services       Precautions / Restrictions Precautions Precautions: Knee Precaution Comments: no pillow under the knee Restrictions Weight Bearing Restrictions: No Other Position/Activity Restrictions:  wbat     Mobility  Bed Mobility Overal bed mobility: Needs Assistance Bed Mobility: Sit to Supine       Sit to supine: Supervision, Min guard   General bed mobility comments: pt able to self rise R LE up onto bed.    Transfers Overall transfer level: Needs assistance Equipment used: Rolling walker (2 wheels) Transfers: Sit to/from Stand, Bed to chair/wheelchair/BSC Sit to Stand: Min assist, From elevated surface           General transfer comment: 25% VC's on proper hand placement and increased time to rise from lower level recliner.  Upond standing, pt reports NO dizziness.    Ambulation/Gait Ambulation/Gait assistance: Supervision, Min guard Gait Distance (Feet): 28 Feet Assistive device: Rollator (4 wheels) Gait Pattern/deviations: Step-to pattern, Decreased stance time - right Gait velocity: decreased     General Gait Details: tolerated amb 28 feet in hallway with NO c/o dizziness and 25% VC's on proper sequencing as well as safety with turns.   Stairs    75% VC's on proper tech up 2 steps BACKWARD using walker as pt does not have rails.           Wheelchair Mobility    Modified Rankin (Stroke Patients Only)       Balance                                            Cognition Arousal/Alertness: Awake/alert Behavior During Therapy: WFL for tasks assessed/performed Overall Cognitive Status: Within Functional Limits for tasks assessed  General Comments: AxO x 3 very pleasant and motivated.  Did require repaet VC's on "new" information as well as handout.        Exercises  Total Knee Replacement TE's following HEP handout 10 reps B LE ankle pumps 05 reps towel squeezes 05 reps knee presses 05 reps heel slides  05 reps SAQ's 05 reps SLR's 05 reps ABD Educated on use of gait belt to assist with TE's Followed by ICE     General Comments        Pertinent Vitals/Pain Pain  Assessment Pain Assessment: Faces Faces Pain Scale: Hurts a little bit Pain Location: right knee Pain Descriptors / Indicators: Operative site guarding Pain Intervention(s): Monitored during session, Premedicated before session, Repositioned, Ice applied    Home Living Family/patient expects to be discharged to:: Private residence Living Arrangements: Spouse/significant other;Children                      Prior Function            PT Goals (current goals can now be found in the care plan section) Progress towards PT goals: Progressing toward goals    Frequency    7X/week      PT Plan Current plan remains appropriate    Co-evaluation              AM-PAC PT "6 Clicks" Mobility   Outcome Measure  Help needed turning from your back to your side while in a flat bed without using bedrails?: A Little Help needed moving from lying on your back to sitting on the side of a flat bed without using bedrails?: A Little Help needed moving to and from a bed to a chair (including a wheelchair)?: A Little Help needed standing up from a chair using your arms (e.g., wheelchair or bedside chair)?: A Little Help needed to walk in hospital room?: A Little Help needed climbing 3-5 steps with a railing? : A Little 6 Click Score: 18    End of Session Equipment Utilized During Treatment: Gait belt Activity Tolerance: Patient tolerated treatment well;No increased pain Patient left: in bed;with call bell/phone within reach;with bed alarm set Nurse Communication: Mobility status PT Visit Diagnosis: Pain;Difficulty in walking, not elsewhere classified (R26.2) Pain - Right/Left: Right Pain - part of body: Knee     Time: 0920-0950 PT Time Calculation (min) (ACUTE ONLY): 30 min  Charges:  $Gait Training: 8-22 mins $Therapeutic Exercise: 8-22 mins                     Rica Koyanagi  PTA [place] Office M-F          [phone] Weekend pager  [phone]

## 2022-05-15 NOTE — TOC Transition Note (Signed)
Transition of Care [facility]) - CM/SW Discharge Note   Patient Details  Name: [name] MRN: [id number] Date of Birth: 05-19-1946  [facility] CM/SW Contact:  Lennart Pall, LCSW Phone Number: 05/15/2022, 12:40 PM   Clinical Narrative:    Have alerted ortho PA and patient that the [facility] agency initially sent his referral for PT has informed me they cannot accept his insurance.  CSW reached out to all other area [facility] agencies and none would accept with the [employer] coverage.  Encouraged pt to speak with MD office about change to OPPT.  No DME needs.  No further TOC needs.   Final next level of care: Home/Self Care Barriers to Discharge: No [facility] will accept this patient   Patient Goals and CMS Choice Patient states their goals for this hospitalization and ongoing recovery are:: return home      Discharge Placement                       Discharge Plan and Services                DME Arranged: N/A DME Agency: NA                  Social Determinants of Health (SDOH) Interventions     Readmission Risk Interventions     No data to display

## 2022-05-15 NOTE — Discharge Summary (Signed)
Patient ID: CORBAN KISTLER MRN: [id number] DOB/AGE: [birth year] 76 y.o.  Admit date: 05/14/2022 Discharge date: 05/15/2022  Admission Diagnoses:  Principal Problem:   Primary localized osteoarthritis of right knee Active Problems:   S/P total knee arthroplasty, right   Discharge Diagnoses:  Same  Past Medical History:  Diagnosis Date   Diabetes mellitus without complication ([place])    ED (erectile dysfunction)    takes Viagra as needed   Family history of breast cancer    Family history of lung cancer    Family history of melanoma    Family history of prostate cancer    History of bronchitis last time about 31yr ago   Hyperlipidemia    takes Simvastatin daily   Hypertension    takes Lisinopril daily   Joint pain    Osteoarthritis    knee   Osteoarthritis of right hip     Surgeries: Procedure(s): RIGHT TOTAL KNEE ARTHROPLASTY on 05/14/2022   Consultants:   Discharged Condition: Improved  Hospital Course: CROMEY MATHIESONis an 76y.o. male who was admitted 05/14/2022 for operative treatment ofPrimary localized osteoarthritis of right knee. Patient has severe unremitting pain that affects sleep, daily activities, and work/hobbies. After pre-op clearance the patient was taken to the operating room on 05/14/2022 and underwent  Procedure(s): RIGHT TOTAL KNEE ARTHROPLASTY.    Patient was given perioperative antibiotics:  Anti-infectives (From admission, onward)    Start     Dose/Rate Route Frequency Ordered Stop   05/14/22 1346  ceFAZolin (ANCEF) 2-4 GM/100ML-% IVPB       Note to Pharmacy: HDara LordsM: cabinet override      05/14/22 1346 05/14/22 2046   05/14/22 1330  ceFAZolin (ANCEF) IVPB 2g/100 mL premix        2 g 200 mL/hr over 30 Minutes Intravenous Every 6 hours 05/14/22 1009 05/14/22 2047   05/14/22 0600  ceFAZolin (ANCEF) IVPB 2g/100 mL premix  Status:  Discontinued        2 g 200 mL/hr over 30 Minutes Intravenous On call to O.R. 05/14/22 0526 05/14/22 0529    05/14/22 0600  ceFAZolin (ANCEF) IVPB 3g/100 mL premix        3 g 200 mL/hr over 30 Minutes Intravenous On call to O.R. 05/14/22 0529 05/14/22 0731        Patient was given sequential compression devices, early ambulation, and chemoprophylaxis to prevent DVT.  Patient benefited maximally from hospital stay and there were no complications.    Recent vital signs: Patient Vitals for the past 24 hrs:  BP Temp Temp src Pulse Resp SpO2  05/15/22 0533 (!) 100/59 (!) 97.5 F (36.4 C) Oral 64 17 96 %  05/15/22 0148 110/60 (!) 97.5 F (36.4 C) Oral 64 17 96 %  05/14/22 2104 99/65 97.8 F (36.6 C) Oral 63 17 95 %  05/14/22 1722 115/69 97.7 F (36.5 C) Oral (!) 59 18 96 %  05/14/22 1509 (!) 155/93 97.8 F (36.6 C) Oral 79 17 97 %  05/14/22 1449 116/74 -- -- 73 -- 96 %  05/14/22 1230 -- -- -- 69 -- 94 %  05/14/22 1200 122/75 -- -- (!) 53 16 96 %  05/14/22 1119 131/79 -- -- 65 -- 94 %  05/14/22 1115 120/69 -- -- 69 15 95 %  05/14/22 1100 108/71 97.9 F (36.6 C) -- 71 17 94 %  05/14/22 1045 112/81 -- -- 72 12 95 %  05/14/22 1030 117/78 -- -- 67 18 92 %  05/14/22 1015 124/77 -- -- 67 (!) 22 97 %  05/14/22 1000 117/74 98 F (36.7 C) -- 65 19 93 %     Recent laboratory studies: No results for input(s): "WBC", "HGB", "HCT", "PLT", "NA", "K", "CL", "CO2", "BUN", "CREATININE", "GLUCOSE", "INR", "CALCIUM" in the last 72 hours.  Invalid input(s): "PT", "2"   Discharge Medications:   Allergies as of 05/15/2022   No Known Allergies      Medication List     TAKE these medications    amoxicillin 500 MG tablet Commonly known as: AMOXIL Take 2,000 mg by mouth See admin instructions. Take 4 tablets (2000 mg) by mouth 1 hour prior to dental appointments.   aspirin EC 81 MG tablet Take 1 tablet (81 mg total) by mouth 2 (two) times daily after a meal. Twice a day for 2 weeks then back to baseline once a day for dvt prevention. What changed:  when to take this additional instructions    ferrous sulfate 325 (65 FE) MG EC tablet Take 325 mg by mouth every evening.   FISH OIL PO Take 1 capsule by mouth every evening.   HYDROcodone-acetaminophen 5-325 MG tablet Commonly known as: NORCO/VICODIN Take 1-2 tablets by mouth every 6 (six) hours as needed for moderate pain or severe pain (post op pain).   Jardiance 25 MG Tabs tablet Generic drug: empagliflozin Take 25 mg by mouth in the morning.   lisinopril 20 MG tablet Commonly known as: ZESTRIL Take 20 mg by mouth daily.   metFORMIN 500 MG tablet Commonly known as: GLUCOPHAGE Take 500 mg by mouth in the morning and at bedtime.   multivitamin with minerals Tabs tablet Take 1 tablet by mouth every evening.   simvastatin 20 MG tablet Commonly known as: ZOCOR Take 20 mg by mouth at bedtime.   tiZANidine 4 MG tablet Commonly known as: Zanaflex Take 1 tablet (4 mg total) by mouth every 6 (six) hours as needed for muscle spasms.   Toujeo SoloStar 300 UNIT/ML Solostar Pen Generic drug: insulin glargine (1 Unit Dial) Inject 30 Units into the skin every evening.   TURMERIC PO Take 1 tablet by mouth in the morning.   VITAMIN B-12 PO Take 1 tablet by mouth in the morning.   VITAMIN C GUMMIE PO Take 1 tablet by mouth in the morning.   VITAMIN D PO Take 1 tablet by mouth at bedtime.               Durable Medical Equipment  (From admission, onward)           Start     Ordered   05/14/22 1514  DME Walker rolling  Once       Question:  Patient needs a walker to treat with the following condition  Answer:  Primary osteoarthritis of right knee   05/14/22 1513   05/14/22 1514  DME 3 n 1  Once        05/14/22 1513   05/14/22 1514  DME Bedside commode  Once       Question:  Patient needs a bedside commode to treat with the following condition  Answer:  Primary osteoarthritis of right knee   05/14/22 1513            Diagnostic Studies: DG Chest 2 View  Result Date: 05/08/2022 CLINICAL DATA:   Preop for knee surgery EXAM: CHEST - 2 VIEW COMPARISON:  Radiographs 01/29/2018 FINDINGS: Stable cardiomediastinal silhouette. Aortic atherosclerotic calcification. No focal consolidation, pleural effusion,  or pneumothorax. No acute osseous abnormality. IMPRESSION: No active cardiopulmonary disease. Electronically Signed   By: Placido Sou M.D.   On: 05/08/2022 02:44    Disposition: Discharge disposition: 01-Home or Self Care       Discharge Instructions     Call MD / Call 911   Complete by: As directed    If you experience chest pain or shortness of breath, CALL 911 and be transported to the hospital emergency room.  If you develope a fever above 101 F, pus (white drainage) or increased drainage or redness at the wound, or calf pain, call your surgeon's office.   Call MD / Call 911   Complete by: As directed    If you experience chest pain or shortness of breath, CALL 911 and be transported to the hospital emergency room.  If you develope a fever above 101 F, pus (white drainage) or increased drainage or redness at the wound, or calf pain, call your surgeon's office.   Constipation Prevention   Complete by: As directed    Drink plenty of fluids.  Prune juice may be helpful.  You may use a stool softener, such as Colace (over the counter) 100 mg twice a day.  Use MiraLax (over the counter) for constipation as needed.   Constipation Prevention   Complete by: As directed    Drink plenty of fluids.  Prune juice may be helpful.  You may use a stool softener, such as Colace (over the counter) 100 mg twice a day.  Use MiraLax (over the counter) for constipation as needed.   Diet - low sodium heart healthy   Complete by: As directed    Diet - low sodium heart healthy   Complete by: As directed    Discharge instructions   Complete by: As directed    INSTRUCTIONS AFTER JOINT REPLACEMENT   Remove items at home which could result in a fall. This includes throw rugs or furniture in walking  pathways ICE to the affected joint every three hours while awake for 30 minutes at a time, for at least the first 3-5 days, and then as needed for pain and swelling.  Continue to use ice for pain and swelling. You may notice swelling that will progress down to the foot and ankle.  This is normal after surgery.  Elevate your leg when you are not up walking on it.   Continue to use the breathing machine you got in the hospital (incentive spirometer) which will help keep your temperature down.  It is common for your temperature to cycle up and down following surgery, especially at night when you are not up moving around and exerting yourself.  The breathing machine keeps your lungs expanded and your temperature down.   DIET:  As you were doing prior to hospitalization, we recommend a well-balanced diet.  DRESSING / WOUND CARE / SHOWERING  You may shower 3 days after surgery, but keep the wounds dry during showering.  You may use an occlusive plastic wrap (Press'n Seal for example), NO SOAKING/SUBMERGING IN THE BATHTUB.  If the bandage gets wet, change with a clean dry gauze.  If the incision gets wet, pat the wound dry with a clean towel.  ACTIVITY  Increase activity slowly as tolerated, but follow the weight bearing instructions below.   No driving for 6 weeks or until further direction given by your physician.  You cannot drive while taking narcotics.  No lifting or carrying greater than 10 lbs. until  further directed by your surgeon. Avoid periods of inactivity such as sitting longer than an hour when not asleep. This helps prevent blood clots.  You may return to work once you are authorized by your doctor.     WEIGHT BEARING   Weight bearing as tolerated with assist device (walker, cane, etc) as directed, use it as long as suggested by your surgeon or therapist, typically at least 4-6 weeks.   EXERCISES  Results after joint replacement surgery are often greatly improved when you follow  the exercise, range of motion and muscle strengthening exercises prescribed by your doctor. Safety measures are also important to protect the joint from further injury. Any time any of these exercises cause you to have increased pain or swelling, decrease what you are doing until you are comfortable again and then slowly increase them. If you have problems or questions, call your caregiver or physical therapist for advice.   Rehabilitation is important following a joint replacement. After just a few days of immobilization, the muscles of the leg can become weakened and shrink (atrophy).  These exercises are designed to build up the tone and strength of the thigh and leg muscles and to improve motion. Often times heat used for twenty to thirty minutes before working out will loosen up your tissues and help with improving the range of motion but do not use heat for the first two weeks following surgery (sometimes heat can increase post-operative swelling).   These exercises can be done on a training (exercise) mat, on the floor, on a table or on a bed. Use whatever works the best and is most comfortable for you.    Use music or television while you are exercising so that the exercises are a pleasant break in your day. This will make your life better with the exercises acting as a break in your routine that you can look forward to.   Perform all exercises about fifteen times, three times per day or as directed.  You should exercise both the operative leg and the other leg as well.  Exercises include:   Quad Sets - Tighten up the muscle on the front of the thigh (Quad) and hold for 5-10 seconds.   Straight Leg Raises - With your knee straight (if you were given a brace, keep it on), lift the leg to 60 degrees, hold for 3 seconds, and slowly lower the leg.  Perform this exercise against resistance later as your leg gets stronger.  Leg Slides: Lying on your back, slowly slide your foot toward your buttocks,  bending your knee up off the floor (only go as far as is comfortable). Then slowly slide your foot back down until your leg is flat on the floor again.  Angel Wings: Lying on your back spread your legs to the side as far apart as you can without causing discomfort.  Hamstring Strength:  Lying on your back, push your heel against the floor with your leg straight by tightening up the muscles of your buttocks.  Repeat, but this time bend your knee to a comfortable angle, and push your heel against the floor.  You may put a pillow under the heel to make it more comfortable if necessary.   A rehabilitation program following joint replacement surgery can speed recovery and prevent re-injury in the future due to weakened muscles. Contact your doctor or a physical therapist for more information on knee rehabilitation.    CONSTIPATION  Constipation is defined medically as fewer  than three stools per week and severe constipation as less than one stool per week.  Even if you have a regular bowel pattern at home, your normal regimen is likely to be disrupted due to multiple reasons following surgery.  Combination of anesthesia, postoperative narcotics, change in appetite and fluid intake all can affect your bowels.   YOU MUST use at least one of the following options; they are listed in order of increasing strength to get the job done.  They are all available over the counter, and you may need to use some, POSSIBLY even all of these options:    Drink plenty of fluids (prune juice may be helpful) and high fiber foods Colace 100 mg by mouth twice a day  Senokot for constipation as directed and as needed Dulcolax (bisacodyl), take with full glass of water  Miralax (polyethylene glycol) once or twice a day as needed.  If you have tried all these things and are unable to have a bowel movement in the first 3-4 days after surgery call either your surgeon or your primary doctor.    If you experience loose stools or  diarrhea, hold the medications until you stool forms back up.  If your symptoms do not get better within 1 week or if they get worse, check with your doctor.  If you experience "the worst abdominal pain ever" or develop nausea or vomiting, please contact the office immediately for further recommendations for treatment.   ITCHING:  If you experience itching with your medications, try taking only a single pain pill, or even half a pain pill at a time.  You can also use Benadryl over the counter for itching or also to help with sleep.   TED HOSE STOCKINGS:  Use stockings on both legs until for at least 2 weeks or as directed by physician office. They may be removed at night for sleeping.  MEDICATIONS:  See your medication summary on the "After Visit Summary" that nursing will review with you.  You may have some home medications which will be placed on hold until you complete the course of blood thinner medication.  It is important for you to complete the blood thinner medication as prescribed.  PRECAUTIONS:  If you experience chest pain or shortness of breath - call 911 immediately for transfer to the hospital emergency department.   If you develop a fever greater that 101 F, purulent drainage from wound, increased redness or drainage from wound, foul odor from the wound/dressing, or calf pain - CONTACT YOUR SURGEON.                                                   FOLLOW-UP APPOINTMENTS:  If you do not already have a post-op appointment, please call the office for an appointment to be seen by your surgeon.  Guidelines for how soon to be seen are listed in your "After Visit Summary", but are typically between 1-4 weeks after surgery.  OTHER INSTRUCTIONS:   Knee Replacement:  Do not place pillow under knee, focus on keeping the knee straight while resting. CPM instructions: 0-90 degrees, 2 hours in the morning, 2 hours in the afternoon, and 2 hours in the evening. Place foam block, curve side up under  heel at all times except when in CPM or when walking.  DO NOT modify, tear, cut,  or change the foam block in any way.  POST-OPERATIVE OPIOID TAPER INSTRUCTIONS: It is important to wean off of your opioid medication as soon as possible. If you do not need pain medication after your surgery it is ok to stop day one. Opioids include: Codeine, Hydrocodone(Norco, Vicodin), Oxycodone(Percocet, oxycontin) and hydromorphone amongst others.  Long term and even short term use of opiods can cause: Increased pain response Dependence Constipation Depression Respiratory depression And more.  Withdrawal symptoms can include Flu like symptoms Nausea, vomiting And more Techniques to manage these symptoms Hydrate well Eat regular healthy meals Stay active Use relaxation techniques(deep breathing, meditating, yoga) Do Not substitute Alcohol to help with tapering If you have been on opioids for less than two weeks and do not have pain than it is ok to stop all together.  Plan to wean off of opioids This plan should start within one week post op of your joint replacement. Maintain the same interval or time between taking each dose and first decrease the dose.  Cut the total daily intake of opioids by one tablet each day Next start to increase the time between doses. The last dose that should be eliminated is the evening dose.     MAKE SURE YOU:  Understand these instructions.  Get help right away if you are not doing well or get worse.    Thank you for letting us be a part of your medical care team.  It is a privilege we respect greatly.  We hope these instructions will help you stay on track for a fast and full recovery!   Discharge instructions   Complete by: As directed    INSTRUCTIONS AFTER JOINT REPLACEMENT   Remove items at home which could result in a fall. This includes throw rugs or furniture in walking pathways ICE to the affected joint every three hours while awake for 30 minutes at  a time, for at least the first 3-5 days, and then as needed for pain and swelling.  Continue to use ice for pain and swelling. You may notice swelling that will progress down to the foot and ankle.  This is normal after surgery.  Elevate your leg when you are not up walking on it.   Continue to use the breathing machine you got in the hospital (incentive spirometer) which will help keep your temperature down.  It is common for your temperature to cycle up and down following surgery, especially at night when you are not up moving around and exerting yourself.  The breathing machine keeps your lungs expanded and your temperature down.   DIET:  As you were doing prior to hospitalization, we recommend a well-balanced diet.  DRESSING / WOUND CARE / SHOWERING  You may shower 3 days after surgery, but keep the wounds dry during showering.  You may use an occlusive plastic wrap (Press'n Seal for example), NO SOAKING/SUBMERGING IN THE BATHTUB.  If the bandage gets wet, change with a clean dry gauze.  If the incision gets wet, pat the wound dry with a clean towel.  ACTIVITY  Increase activity slowly as tolerated, but follow the weight bearing instructions below.   No driving for 6 weeks or until further direction given by your physician.  You cannot drive while taking narcotics.  No lifting or carrying greater than 10 lbs. until further directed by your surgeon. Avoid periods of inactivity such as sitting longer than an hour when not asleep. This helps prevent blood clots.  You may return  to work once you are authorized by your doctor.     WEIGHT BEARING   Weight bearing as tolerated with assist device (walker, cane, etc) as directed, use it as long as suggested by your surgeon or therapist, typically at least 4-6 weeks.   EXERCISES  Results after joint replacement surgery are often greatly improved when you follow the exercise, range of motion and muscle strengthening exercises prescribed by your  doctor. Safety measures are also important to protect the joint from further injury. Any time any of these exercises cause you to have increased pain or swelling, decrease what you are doing until you are comfortable again and then slowly increase them. If you have problems or questions, call your caregiver or physical therapist for advice.   Rehabilitation is important following a joint replacement. After just a few days of immobilization, the muscles of the leg can become weakened and shrink (atrophy).  These exercises are designed to build up the tone and strength of the thigh and leg muscles and to improve motion. Often times heat used for twenty to thirty minutes before working out will loosen up your tissues and help with improving the range of motion but do not use heat for the first two weeks following surgery (sometimes heat can increase post-operative swelling).   These exercises can be done on a training (exercise) mat, on the floor, on a table or on a bed. Use whatever works the best and is most comfortable for you.    Use music or television while you are exercising so that the exercises are a pleasant break in your day. This will make your life better with the exercises acting as a break in your routine that you can look forward to.   Perform all exercises about fifteen times, three times per day or as directed.  You should exercise both the operative leg and the other leg as well.  Exercises include:   Quad Sets - Tighten up the muscle on the front of the thigh (Quad) and hold for 5-10 seconds.   Straight Leg Raises - With your knee straight (if you were given a brace, keep it on), lift the leg to 60 degrees, hold for 3 seconds, and slowly lower the leg.  Perform this exercise against resistance later as your leg gets stronger.  Leg Slides: Lying on your back, slowly slide your foot toward your buttocks, bending your knee up off the floor (only go as far as is comfortable). Then slowly slide  your foot back down until your leg is flat on the floor again.  Angel Wings: Lying on your back spread your legs to the side as far apart as you can without causing discomfort.  Hamstring Strength:  Lying on your back, push your heel against the floor with your leg straight by tightening up the muscles of your buttocks.  Repeat, but this time bend your knee to a comfortable angle, and push your heel against the floor.  You may put a pillow under the heel to make it more comfortable if necessary.   A rehabilitation program following joint replacement surgery can speed recovery and prevent re-injury in the future due to weakened muscles. Contact your doctor or a physical therapist for more information on knee rehabilitation.    CONSTIPATION  Constipation is defined medically as fewer than three stools per week and severe constipation as less than one stool per week.  Even if you have a regular bowel pattern at home, your normal  regimen is likely to be disrupted due to multiple reasons following surgery.  Combination of anesthesia, postoperative narcotics, change in appetite and fluid intake all can affect your bowels.   YOU MUST use at least one of the following options; they are listed in order of increasing strength to get the job done.  They are all available over the counter, and you may need to use some, POSSIBLY even all of these options:    Drink plenty of fluids (prune juice may be helpful) and high fiber foods Colace 100 mg by mouth twice a day  Senokot for constipation as directed and as needed Dulcolax (bisacodyl), take with full glass of water  Miralax (polyethylene glycol) once or twice a day as needed.  If you have tried all these things and are unable to have a bowel movement in the first 3-4 days after surgery call either your surgeon or your primary doctor.    If you experience loose stools or diarrhea, hold the medications until you stool forms back up.  If your symptoms do not  get better within 1 week or if they get worse, check with your doctor.  If you experience "the worst abdominal pain ever" or develop nausea or vomiting, please contact the office immediately for further recommendations for treatment.   ITCHING:  If you experience itching with your medications, try taking only a single pain pill, or even half a pain pill at a time.  You can also use Benadryl over the counter for itching or also to help with sleep.   TED HOSE STOCKINGS:  Use stockings on both legs until for at least 2 weeks or as directed by physician office. They may be removed at night for sleeping.  MEDICATIONS:  See your medication summary on the "After Visit Summary" that nursing will review with you.  You may have some home medications which will be placed on hold until you complete the course of blood thinner medication.  It is important for you to complete the blood thinner medication as prescribed.  PRECAUTIONS:  If you experience chest pain or shortness of breath - call 911 immediately for transfer to the hospital emergency department.   If you develop a fever greater that 101 F, purulent drainage from wound, increased redness or drainage from wound, foul odor from the wound/dressing, or calf pain - CONTACT YOUR SURGEON.                                                   FOLLOW-UP APPOINTMENTS:  If you do not already have a post-op appointment, please call the office for an appointment to be seen by your surgeon.  Guidelines for how soon to be seen are listed in your "After Visit Summary", but are typically between 1-4 weeks after surgery.  OTHER INSTRUCTIONS:   Knee Replacement:  Do not place pillow under knee, focus on keeping the knee straight while resting. CPM instructions: 0-90 degrees, 2 hours in the morning, 2 hours in the afternoon, and 2 hours in the evening. Place foam block, curve side up under heel at all times except when in CPM or when walking.  DO NOT modify, tear, cut, or  change the foam block in any way.  POST-OPERATIVE OPIOID TAPER INSTRUCTIONS: It is important to wean off of your opioid medication as soon as possible. If  you do not need pain medication after your surgery it is ok to stop day one. Opioids include: Codeine, Hydrocodone(Norco, Vicodin), Oxycodone(Percocet, oxycontin) and hydromorphone amongst others.  Long term and even short term use of opiods can cause: Increased pain response Dependence Constipation Depression Respiratory depression And more.  Withdrawal symptoms can include Flu like symptoms Nausea, vomiting And more Techniques to manage these symptoms Hydrate well Eat regular healthy meals Stay active Use relaxation techniques(deep breathing, meditating, yoga) Do Not substitute Alcohol to help with tapering If you have been on opioids for less than two weeks and do not have pain than it is ok to stop all together.  Plan to wean off of opioids This plan should start within one week post op of your joint replacement. Maintain the same interval or time between taking each dose and first decrease the dose.  Cut the total daily intake of opioids by one tablet each day Next start to increase the time between doses. The last dose that should be eliminated is the evening dose.     MAKE SURE YOU:  Understand these instructions.  Get help right away if you are not doing well or get worse.    Thank you for letting us be a part of your medical care team.  It is a privilege we respect greatly.  We hope these instructions will help you stay on track for a fast and full recovery!   Increase activity slowly as tolerated   Complete by: As directed    Increase activity slowly as tolerated   Complete by: As directed    Post-operative opioid taper instructions:   Complete by: As directed    POST-OPERATIVE OPIOID TAPER INSTRUCTIONS: It is important to wean off of your opioid medication as soon as possible. If you do not need pain  medication after your surgery it is ok to stop day one. Opioids include: Codeine, Hydrocodone(Norco, Vicodin), Oxycodone(Percocet, oxycontin) and hydromorphone amongst others.  Long term and even short term use of opiods can cause: Increased pain response Dependence Constipation Depression Respiratory depression And more.  Withdrawal symptoms can include Flu like symptoms Nausea, vomiting And more Techniques to manage these symptoms Hydrate well Eat regular healthy meals Stay active Use relaxation techniques(deep breathing, meditating, yoga) Do Not substitute Alcohol to help with tapering If you have been on opioids for less than two weeks and do not have pain than it is ok to stop all together.  Plan to wean off of opioids This plan should start within one week post op of your joint replacement. Maintain the same interval or time between taking each dose and first decrease the dose.  Cut the total daily intake of opioids by one tablet each day Next start to increase the time between doses. The last dose that should be eliminated is the evening dose.      Post-operative opioid taper instructions:   Complete by: As directed    POST-OPERATIVE OPIOID TAPER INSTRUCTIONS: It is important to wean off of your opioid medication as soon as possible. If you do not need pain medication after your surgery it is ok to stop day one. Opioids include: Codeine, Hydrocodone(Norco, Vicodin), Oxycodone(Percocet, oxycontin) and hydromorphone amongst others.  Long term and even short term use of opiods can cause: Increased pain response Dependence Constipation Depression Respiratory depression And more.  Withdrawal symptoms can include Flu like symptoms Nausea, vomiting And more Techniques to manage these symptoms Hydrate well Eat regular healthy meals Stay active Use relaxation  techniques(deep breathing, meditating, yoga) Do Not substitute Alcohol to help with tapering If you have been  on opioids for less than two weeks and do not have pain than it is ok to stop all together.  Plan to wean off of opioids This plan should start within one week post op of your joint replacement. Maintain the same interval or time between taking each dose and first decrease the dose.  Cut the total daily intake of opioids by one tablet each day Next start to increase the time between doses. The last dose that should be eliminated is the evening dose.           Follow-up Information     Melrose Nakayama, MD. Schedule an appointment as soon as possible for a visit in 2 week(s).   Specialty: Orthopedic Surgery Contact information: [place] [address] [phone]                  Signed: Larwance Sachs Jermia Rigsby 05/15/2022, 8:18 AM

## 2022-05-17 DIAGNOSIS — Z96651 Presence of right artificial knee joint: Secondary | ICD-10-CM | POA: Diagnosis not present

## 2022-05-17 DIAGNOSIS — M25661 Stiffness of right knee, not elsewhere classified: Secondary | ICD-10-CM | POA: Diagnosis not present

## 2022-07-02 DIAGNOSIS — Z96651 Presence of right artificial knee joint: Secondary | ICD-10-CM | POA: Diagnosis not present

## 2022-07-02 DIAGNOSIS — M25661 Stiffness of right knee, not elsewhere classified: Secondary | ICD-10-CM | POA: Diagnosis not present

## 2022-07-05 DIAGNOSIS — Z96651 Presence of right artificial knee joint: Secondary | ICD-10-CM | POA: Diagnosis not present

## 2022-07-05 DIAGNOSIS — M25661 Stiffness of right knee, not elsewhere classified: Secondary | ICD-10-CM | POA: Diagnosis not present

## 2022-07-08 DIAGNOSIS — I10 Essential (primary) hypertension: Secondary | ICD-10-CM | POA: Diagnosis not present

## 2022-07-08 DIAGNOSIS — E1165 Type 2 diabetes mellitus with hyperglycemia: Secondary | ICD-10-CM | POA: Diagnosis not present

## 2022-07-08 DIAGNOSIS — E7800 Pure hypercholesterolemia, unspecified: Secondary | ICD-10-CM | POA: Diagnosis not present

## 2022-07-10 DIAGNOSIS — M25661 Stiffness of right knee, not elsewhere classified: Secondary | ICD-10-CM | POA: Diagnosis not present

## 2022-07-10 DIAGNOSIS — Z96651 Presence of right artificial knee joint: Secondary | ICD-10-CM | POA: Diagnosis not present

## 2022-07-12 DIAGNOSIS — M25661 Stiffness of right knee, not elsewhere classified: Secondary | ICD-10-CM | POA: Diagnosis not present

## 2022-07-12 DIAGNOSIS — Z96651 Presence of right artificial knee joint: Secondary | ICD-10-CM | POA: Diagnosis not present

## 2022-07-16 DIAGNOSIS — Z96651 Presence of right artificial knee joint: Secondary | ICD-10-CM | POA: Diagnosis not present

## 2022-07-16 DIAGNOSIS — M25661 Stiffness of right knee, not elsewhere classified: Secondary | ICD-10-CM | POA: Diagnosis not present

## 2022-07-18 DIAGNOSIS — M25661 Stiffness of right knee, not elsewhere classified: Secondary | ICD-10-CM | POA: Diagnosis not present

## 2022-07-18 DIAGNOSIS — Z96651 Presence of right artificial knee joint: Secondary | ICD-10-CM | POA: Diagnosis not present

## 2022-07-23 DIAGNOSIS — M25661 Stiffness of right knee, not elsewhere classified: Secondary | ICD-10-CM | POA: Diagnosis not present

## 2022-07-23 DIAGNOSIS — Z96651 Presence of right artificial knee joint: Secondary | ICD-10-CM | POA: Diagnosis not present

## 2022-07-25 DIAGNOSIS — Z96651 Presence of right artificial knee joint: Secondary | ICD-10-CM | POA: Diagnosis not present

## 2022-07-25 DIAGNOSIS — M25661 Stiffness of right knee, not elsewhere classified: Secondary | ICD-10-CM | POA: Diagnosis not present

## 2022-07-29 DIAGNOSIS — Z96651 Presence of right artificial knee joint: Secondary | ICD-10-CM | POA: Diagnosis not present

## 2022-07-29 DIAGNOSIS — M25661 Stiffness of right knee, not elsewhere classified: Secondary | ICD-10-CM | POA: Diagnosis not present

## 2022-07-30 DIAGNOSIS — Z96651 Presence of right artificial knee joint: Secondary | ICD-10-CM | POA: Diagnosis not present

## 2022-07-30 DIAGNOSIS — M25661 Stiffness of right knee, not elsewhere classified: Secondary | ICD-10-CM | POA: Diagnosis not present

## 2022-07-31 DIAGNOSIS — I10 Essential (primary) hypertension: Secondary | ICD-10-CM | POA: Diagnosis not present

## 2022-07-31 DIAGNOSIS — Z0000 Encounter for general adult medical examination without abnormal findings: Secondary | ICD-10-CM | POA: Diagnosis not present

## 2022-07-31 DIAGNOSIS — E1169 Type 2 diabetes mellitus with other specified complication: Secondary | ICD-10-CM | POA: Diagnosis not present

## 2022-07-31 DIAGNOSIS — E782 Mixed hyperlipidemia: Secondary | ICD-10-CM | POA: Diagnosis not present

## 2022-08-02 DIAGNOSIS — M25661 Stiffness of right knee, not elsewhere classified: Secondary | ICD-10-CM | POA: Diagnosis not present

## 2022-08-02 DIAGNOSIS — Z96651 Presence of right artificial knee joint: Secondary | ICD-10-CM | POA: Diagnosis not present

## 2022-08-20 DIAGNOSIS — M25661 Stiffness of right knee, not elsewhere classified: Secondary | ICD-10-CM | POA: Diagnosis not present

## 2022-08-20 DIAGNOSIS — Z96651 Presence of right artificial knee joint: Secondary | ICD-10-CM | POA: Diagnosis not present

## 2022-08-28 DIAGNOSIS — Z96651 Presence of right artificial knee joint: Secondary | ICD-10-CM | POA: Diagnosis not present

## 2022-08-28 DIAGNOSIS — M25661 Stiffness of right knee, not elsewhere classified: Secondary | ICD-10-CM | POA: Diagnosis not present

## 2022-09-04 DIAGNOSIS — M25562 Pain in left knee: Secondary | ICD-10-CM | POA: Diagnosis not present

## 2022-09-13 DIAGNOSIS — Z96651 Presence of right artificial knee joint: Secondary | ICD-10-CM | POA: Diagnosis not present

## 2022-09-13 DIAGNOSIS — M25661 Stiffness of right knee, not elsewhere classified: Secondary | ICD-10-CM | POA: Diagnosis not present

## 2022-09-23 ENCOUNTER — Ambulatory Visit: Payer: Managed Care, Other (non HMO) | Admitting: Dermatology

## 2022-09-23 ENCOUNTER — Encounter: Payer: Self-pay | Admitting: Dermatology

## 2022-09-23 VITALS — BP 140/87

## 2022-09-23 DIAGNOSIS — L309 Dermatitis, unspecified: Secondary | ICD-10-CM

## 2022-09-23 MED ORDER — TRIAMCINOLONE ACETONIDE 0.1 % EX CREA: 1.0000 | TOPICAL_CREAM | Freq: Two times a day (BID) | CUTANEOUS | 2 refills | Status: AC

## 2022-09-23 NOTE — Progress Notes (Signed)
   New Patient Visit  Subjective  [name] is a 77 y.o. male who presents for the following: Other (Right knee replacement in November and he started breaking out in rashes in December that would come and go. Saw Dr Brigitte Pulse and she wanted to wait a week and recheck but it had cleared up. They came back about a week later. He went to [place] beach [place] and forgot his Jardiance and cleared. Restarted Jardiance once he was home and he broke out again. He stopped the [place] and it cleared up again but then it broke out again. Comes up as a patch of small bumps. 5/10 itch, HC cream).    The following portions of the chart were reviewed this encounter and updated as appropriate:   Tobacco  Allergies  Meds  Problems  Med Hx  Surg Hx  Fam Hx      Review of Systems:  No other skin or systemic complaints except as noted in HPI or Assessment and Plan.  Objective  Well appearing patient in no apparent distress; mood and affect are within normal limits.  A focused examination was performed including arms, legs. Relevant physical exam findings are noted in the Assessment and Plan.  No active lessons on PE, Negative dermatographism today.    Assessment & Plan  Dermatitis  Suspected bite reaction (?bed bugs vs other arthropods) vs environmental  TMC 0.1% cream bid to affected areas of rash prn. Avoid face, groin, underarms  RTC if rash flares. Advised patient to leave a spot untreated with TMC cream in case a biopsy is warranted.  triamcinolone cream (KENALOG) 0.1 % Apply 1 Application topically 2 (two) times daily. Apply to affected areas of rash as needed. Avoid face, groin, underarms.   Return if symptoms worsen or fail to improve.  I, Ashok Cordia, CMA, am acting as scribe for Ellard Artis, MD .  Documentation: I have reviewed the above documentation for accuracy and completeness, and I agree with the above  [place], DO

## 2022-09-23 NOTE — Patient Instructions (Addendum)
Return to clinic if rash flares. Advised patient to leave a spot untreated with Triamcinolone cream cream in case a biopsy is warranted.   Topical steroids (such as triamcinolone, fluocinolone, fluocinonide, mometasone, clobetasol, halobetasol, betamethasone, hydrocortisone) can cause thinning and lightening of the skin if they are used for too long in the same area. Your physician has selected the right strength medicine for your problem and area affected on the body. Please use your medication only as directed by your physician to prevent side effects.    Due to recent changes in healthcare laws, you may see results of your pathology and/or laboratory studies on MyChart before the doctors have had a chance to review them. We understand that in some cases there may be results that are confusing or concerning to you. Please understand that not all results are received at the same time and often the doctors may need to interpret multiple results in order to provide you with the best plan of care or course of treatment. Therefore, we ask that you please give [place] 2 business days to thoroughly review all your results before contacting the office for clarification. Should we see a critical lab result, you will be contacted sooner.   If You Need Anything After Your Visit  If you have any questions or concerns for your doctor, please call our main line at [phone] If no one answers, please leave a voicemail as directed and we will return your call as soon as possible. Messages left after 4 pm will be answered the following business day.   You may also send [place] a message via [place]. We typically respond to MyChart messages within 1-2 business days.  For prescription refills, please ask your pharmacy to contact our office. Our fax number is [phone].  If you have an urgent issue when the clinic is closed that cannot wait until the next business day, you can page your doctor at the number below.     Please note that while we do our best to be available for urgent issues outside of office hours, we are not available 24/7.   If you have an urgent issue and are unable to reach [place], you may choose to seek medical care at your doctor's office, retail clinic, urgent care center, or emergency room.  If you have a medical emergency, please immediately call 911 or go to the emergency department. In the event of inclement weather, please call our main line at [phone] for an update on the status of any delays or closures.  Dermatology Medication Tips: Please keep the boxes that topical medications come in in order to help keep track of the instructions about where and how to use these. Pharmacies typically print the medication instructions only on the boxes and not directly on the medication tubes.   If your medication is too expensive, please contact our office at [phone] or send [place] a message through [place].   We are unable to tell what your co-pay for medications will be in advance as this is different depending on your insurance coverage. However, we may be able to find a substitute medication at lower cost or fill out paperwork to get insurance to cover a needed medication.   If a prior authorization is required to get your medication covered by your insurance company, please allow [place] 1-2 business days to complete this process.  Drug prices often vary depending on where the prescription is filled and some pharmacies may offer cheaper prices.  The  website www.goodrx.com contains coupons for medications through different pharmacies. The prices here do not account for what the cost may be with help from insurance (it may be cheaper with your insurance), but the website can give you the price if you did not use any insurance.  - You can print the associated coupon and take it with your prescription to the pharmacy.  - You may also stop by our office during regular business hours and pick  up a GoodRx coupon card.  - If you need your prescription sent electronically to a different pharmacy, notify our office through [facility] or by phone at [phone]

## 2022-09-26 ENCOUNTER — Ambulatory Visit: Payer: Managed Care, Other (non HMO) | Admitting: Dermatology

## 2022-10-02 ENCOUNTER — Telehealth: Payer: Self-pay | Admitting: Dermatology

## 2022-10-02 NOTE — Telephone Encounter (Signed)
Call patient he has immediate concerns regarding  sort of bites that he believes to be from fleas- he said the cream Dr. Shanon Brow prescribed him has been working but now it is spreading. Please call regarding follow up appointment or instructions.

## 2022-10-03 ENCOUNTER — Telehealth: Payer: Self-pay | Admitting: Dermatology

## 2022-10-03 NOTE — Telephone Encounter (Signed)
[facility]

## 2022-10-03 NOTE — Telephone Encounter (Signed)
Please call patient and let him know that if suspects fleas are biting him then the best next step is to call an exterminator and have his animals treated.  If the cream I rx's is working then he should continue using that as directed.  I would not necessarily change his rx if he came in for an appointment.

## 2022-10-03 NOTE — Telephone Encounter (Signed)
Returning missed called from Dr. Shanon Brow. Please return call.

## 2022-11-18 ENCOUNTER — Ambulatory Visit: Payer: Managed Care, Other (non HMO) | Admitting: Dermatology

## 2023-05-12 DIAGNOSIS — M25661 Stiffness of right knee, not elsewhere classified: Secondary | ICD-10-CM | POA: Diagnosis not present

## 2023-07-04 DIAGNOSIS — U071 COVID-19: Secondary | ICD-10-CM | POA: Diagnosis not present

## 2023-07-04 DIAGNOSIS — E1169 Type 2 diabetes mellitus with other specified complication: Secondary | ICD-10-CM | POA: Diagnosis not present

## 2023-07-04 DIAGNOSIS — I10 Essential (primary) hypertension: Secondary | ICD-10-CM | POA: Diagnosis not present

## 2023-08-05 DIAGNOSIS — Z125 Encounter for screening for malignant neoplasm of prostate: Secondary | ICD-10-CM | POA: Diagnosis not present

## 2023-08-05 DIAGNOSIS — E1169 Type 2 diabetes mellitus with other specified complication: Secondary | ICD-10-CM | POA: Diagnosis not present

## 2023-08-05 DIAGNOSIS — E782 Mixed hyperlipidemia: Secondary | ICD-10-CM | POA: Diagnosis not present

## 2023-08-05 DIAGNOSIS — Z0000 Encounter for general adult medical examination without abnormal findings: Secondary | ICD-10-CM | POA: Diagnosis not present

## 2023-08-05 DIAGNOSIS — I10 Essential (primary) hypertension: Secondary | ICD-10-CM | POA: Diagnosis not present

## 2023-08-05 DIAGNOSIS — Z23 Encounter for immunization: Secondary | ICD-10-CM | POA: Diagnosis not present

## 2024-05-13 ENCOUNTER — Other Ambulatory Visit: Payer: Self-pay | Admitting: Student

## 2024-05-13 DIAGNOSIS — R7989 Other specified abnormal findings of blood chemistry: Secondary | ICD-10-CM

## 2024-05-24 ENCOUNTER — Other Ambulatory Visit

## 2024-05-25 ENCOUNTER — Ambulatory Visit
Admission: RE | Admit: 2024-05-25 | Discharge: 2024-05-25 | Disposition: A | Discharge status: Home / Self Care | Source: Ambulatory Visit | Attending: Student | Admitting: Student

## 2024-05-25 DIAGNOSIS — R7989 Other specified abnormal findings of blood chemistry: Secondary | ICD-10-CM

## 2024-05-31 ENCOUNTER — Other Ambulatory Visit: Payer: Self-pay | Admitting: Student

## 2024-05-31 DIAGNOSIS — K760 Fatty (change of) liver, not elsewhere classified: Secondary | ICD-10-CM

## 2024-06-21 ENCOUNTER — Other Ambulatory Visit

## 2024-07-12 ENCOUNTER — Other Ambulatory Visit
